# Patient Record
Sex: Male | Born: 1940
Health system: Southern US, Community
[De-identification: ages and names within clinical notes are randomized; demographics above are authoritative.]

## PROBLEM LIST (undated history)

## (undated) DIAGNOSIS — H47011 Ischemic optic neuropathy, right eye: Secondary | ICD-10-CM

## (undated) DIAGNOSIS — H269 Unspecified cataract: Secondary | ICD-10-CM

## (undated) DIAGNOSIS — I10 Essential (primary) hypertension: Secondary | ICD-10-CM

## (undated) DIAGNOSIS — M5126 Other intervertebral disc displacement, lumbar region: Secondary | ICD-10-CM

## (undated) DIAGNOSIS — E785 Hyperlipidemia, unspecified: Secondary | ICD-10-CM

## (undated) HISTORY — DX: Unspecified cataract: H26.9

## (undated) HISTORY — DX: Essential (primary) hypertension: I10

## (undated) HISTORY — PX: CATARACT EXTRACTION: SUR2

## (undated) HISTORY — DX: Ischemic optic neuropathy, right eye: H47.011

## (undated) HISTORY — PX: TONSILLECTOMY: SUR1361

## (undated) HISTORY — PX: OTHER SURGICAL HISTORY: SHX169

## (undated) HISTORY — DX: Other intervertebral disc displacement, lumbar region: M51.26

## (undated) HISTORY — DX: Hyperlipidemia, unspecified: E78.5

## (undated) HISTORY — PX: FINGER SURGERY: SHX640

---

## 1998-01-21 ENCOUNTER — Ambulatory Visit (HOSPITAL_COMMUNITY): Admission: RE | Admit: 1998-01-21 | Discharge: 1998-01-21 | Payer: Self-pay | Admitting: Internal Medicine

## 2001-02-10 ENCOUNTER — Encounter: Payer: Self-pay | Admitting: Urology

## 2001-02-10 ENCOUNTER — Ambulatory Visit (HOSPITAL_COMMUNITY): Admission: RE | Admit: 2001-02-10 | Discharge: 2001-02-10 | Payer: Self-pay | Admitting: Urology

## 2004-01-16 ENCOUNTER — Ambulatory Visit (HOSPITAL_COMMUNITY): Admission: RE | Admit: 2004-01-16 | Discharge: 2004-01-16 | Payer: Self-pay | Admitting: Oral Surgery

## 2004-01-29 ENCOUNTER — Encounter (INDEPENDENT_AMBULATORY_CARE_PROVIDER_SITE_OTHER): Payer: Self-pay | Admitting: *Deleted

## 2004-01-29 ENCOUNTER — Ambulatory Visit (HOSPITAL_COMMUNITY): Admission: RE | Admit: 2004-01-29 | Discharge: 2004-01-29 | Payer: Self-pay | Admitting: Oral Surgery

## 2004-03-24 ENCOUNTER — Ambulatory Visit (HOSPITAL_BASED_OUTPATIENT_CLINIC_OR_DEPARTMENT_OTHER): Admission: RE | Admit: 2004-03-24 | Discharge: 2004-03-24 | Payer: Self-pay | Admitting: Otolaryngology

## 2004-05-12 ENCOUNTER — Ambulatory Visit: Payer: Self-pay | Admitting: Oncology

## 2004-10-13 ENCOUNTER — Ambulatory Visit: Payer: Self-pay | Admitting: Gastroenterology

## 2004-11-24 ENCOUNTER — Ambulatory Visit: Payer: Self-pay | Admitting: Gastroenterology

## 2004-12-04 ENCOUNTER — Ambulatory Visit: Payer: Self-pay | Admitting: Gastroenterology

## 2009-11-15 ENCOUNTER — Encounter (INDEPENDENT_AMBULATORY_CARE_PROVIDER_SITE_OTHER): Payer: Self-pay | Admitting: *Deleted

## 2010-02-24 ENCOUNTER — Encounter (INDEPENDENT_AMBULATORY_CARE_PROVIDER_SITE_OTHER): Payer: Self-pay | Admitting: *Deleted

## 2010-03-18 ENCOUNTER — Encounter (INDEPENDENT_AMBULATORY_CARE_PROVIDER_SITE_OTHER): Payer: Self-pay | Admitting: *Deleted

## 2010-03-28 ENCOUNTER — Encounter (INDEPENDENT_AMBULATORY_CARE_PROVIDER_SITE_OTHER): Payer: Self-pay | Admitting: *Deleted

## 2010-03-28 ENCOUNTER — Encounter (INDEPENDENT_AMBULATORY_CARE_PROVIDER_SITE_OTHER): Payer: Self-pay

## 2010-04-01 ENCOUNTER — Ambulatory Visit: Payer: Self-pay | Admitting: Gastroenterology

## 2010-04-15 ENCOUNTER — Ambulatory Visit: Payer: Self-pay | Admitting: Gastroenterology

## 2010-04-16 ENCOUNTER — Encounter: Payer: Self-pay | Admitting: Gastroenterology

## 2010-07-15 NOTE — Letter (Signed)
Summary: Colonoscopy Letter  [facility]  [address] [place], [place] [address]   Phone: [phone]  Fax: [phone]      November 15, 2009 MRN: [id number]   CHATHAM HOWINGTON [address] [place], [place]  [address]   Dear Mr. Lupi,   According to your medical record, it is time for you to schedule a Colonoscopy. The American Cancer Society recommends this procedure as a method to detect early colon cancer. Patients with a family history of colon cancer, or a personal history of colon polyps or inflammatory bowel disease are at increased risk.  This letter has been generated based on the recommendations made at the time of your procedure. If you feel that in your particular situation this may no longer apply, please contact our office.  Please call our office at [phone] to schedule this appointment or to update your records at your earliest convenience.  Thank you for cooperating with [place] to provide you with the very best care possible.   Sincerely,  Wilhemina Bonito. Marina Goodell, M.D.  [facility] Gastroenterology Division [phone]

## 2010-07-15 NOTE — Letter (Signed)
Summary: Patient Notice- Polyp Results  [facility]  [address] [place], [place] [address]   Phone: [phone]  Fax: [phone]        April 16, 2010 MRN: [id number]    RONTAE INGLETT [address] [place], [place]  [address]    Dear Mr. Hangartner,  I am pleased to inform you that the colon polyp(s) removed during your recent colonoscopy was (were) found to be benign (no cancer detected) upon pathologic examination.  I recommend you have a repeat colonoscopy examination in 5 years to look for recurrent polyps, as having colon polyps increases your risk for having recurrent polyps or even colon cancer in the future.  Should you develop new or worsening symptoms of abdominal pain, bowel habit changes or bleeding from the rectum or bowels, please schedule an evaluation with either your primary care physician or with me.  Continue treatment plan as outlined the day of your exam.  Please call us if you are having persistent problems or have questions about your condition that have not been fully answered at this time.  Sincerely,  Meryl Dare MD [facility]  This letter has been electronically signed by your physician.  Appended Document: Patient Notice- Polyp Results letter mailed

## 2010-07-15 NOTE — Letter (Signed)
Summary: [facility] Instructions  [facility]  [address] [place], [place] [address]   Phone: [phone]  Fax: [phone]       [name]    07-Jun-1941    MRN: [id number]        Procedure Day /Date: Tuesday 04-15-10     Arrival Time: 9:30 am     Procedure Time: 10:30 am     Location of Procedure:                    _x _  [facility] (4th Floor)   PREPARATION FOR COLONOSCOPY WITH MOVIPREP   Starting 5 days prior to your procedure  04-10-10 do not eat nuts, seeds, popcorn, corn, beans, peas,  salads, or any raw vegetables.  Do not take any fiber supplements (e.g. Metamucil, Citrucel, and Benefiber).  THE DAY BEFORE YOUR PROCEDURE         DATE:  04-14-10  DAY:  Monday  1.  Drink clear liquids the entire day-NO SOLID FOOD  2.  Do not drink anything colored red or purple.  Avoid juices with pulp.  No orange juice.  3.  Drink at least 64 oz. (8 glasses) of fluid/clear liquids during the day to prevent dehydration and help the prep work efficiently.  CLEAR LIQUIDS INCLUDE: Water Jello Ice Popsicles Tea (sugar ok, no milk/cream) Powdered fruit flavored drinks Coffee (sugar ok, no milk/cream) Gatorade Juice: apple, white grape, white cranberry  Lemonade Clear bullion, consomm, broth Carbonated beverages (any kind) Strained chicken noodle soup Hard Candy                             4.  In the morning, mix first dose of MoviPrep solution:    Empty 1 Pouch A and 1 Pouch B into the disposable container    Add lukewarm drinking water to the top line of the container. Mix to dissolve    Refrigerate (mixed solution should be used within 24 hrs)  5.  Begin drinking the prep at 5:00 p.m. The MoviPrep container is divided by 4 marks.   Every 15 minutes drink the solution down to the next mark (approximately 8 oz) until the full liter is complete.   6.  Follow completed prep with 16 oz of clear liquid of your choice (Nothing red or purple).   Continue to drink clear liquids until bedtime.  7.  Before going to bed, mix second dose of MoviPrep solution:    Empty 1 Pouch A and 1 Pouch B into the disposable container    Add lukewarm drinking water to the top line of the container. Mix to dissolve    Refrigerate  THE DAY OF YOUR PROCEDURE      DATE:  04-15-10  DAY:  Tuesday  Beginning at  5:30 a.m. (5 hours before procedure):         1. Every 15 minutes, drink the solution down to the next mark (approx 8 oz) until the full liter is complete.  2. Follow completed prep with 16 oz. of clear liquid of your choice.    3. You may drink clear liquids until  8:30 a.m. (2 HOURS BEFORE PROCEDURE).   MEDICATION INSTRUCTIONS  Unless otherwise instructed, you should take regular prescription medications with a small sip of water   as early as possible the morning of your procedure.  Additional medication instructions: Do not take HCTZ am of procedure.  OTHER INSTRUCTIONS  You will need a responsible adult at least 71 years of age to accompany you and drive you home.   This person must remain in the waiting room during your procedure.  Wear loose fitting clothing that is easily removed.  Leave jewelry and other valuables at home.  However, you may wish to bring a book to read or  an iPod/MP3 player to listen to music as you wait for your procedure to start.  Remove all body piercing jewelry and leave at home.  Total time from sign-in until discharge is approximately 2-3 hours.  You should go home directly after your procedure and rest.  You can resume normal activities the  day after your procedure.  The day of your procedure you should not:   Drive   Make legal decisions   Operate machinery   Drink alcohol   Return to work  You will receive specific instructions about eating, activities and medications before you leave.    The above instructions have been reviewed and explained to me by   Ulis Rias  RN  April 01, 2010 1:48 PM     I fully understand and can verbalize these instructions _____________________________ Date _________

## 2010-07-15 NOTE — Miscellaneous (Signed)
Summary: Lec previsit  Clinical Lists Changes  Medications: Added new medication of MOVIPREP 100 GM  SOLR (PEG-KCL-NACL-NASULF-NA ASC-C) As per prep instructions. - Signed Rx of MOVIPREP 100 GM  SOLR (PEG-KCL-NACL-NASULF-NA ASC-C) As per prep instructions.;  #1 x 0;  Signed;  Entered by: Ulis Rias RN;  Authorized by: Meryl Dare MD [facility];  Method used: Electronically to [facility]. [facility] [phone]*, [address] [place], [place]  [id number], Ph: [phone], Fax: [phone] Observations: Added new observation of NKA: T (04/01/2010 13:19)    Prescriptions: MOVIPREP 100 GM  SOLR (PEG-KCL-NACL-NASULF-NA ASC-C) As per prep instructions.  #1 x 0   Entered by:   Ulis Rias RN   Authorized by:   Meryl Dare MD [facility]   Signed by:   Ulis Rias RN on 04/01/2010   Method used:   Electronically to        [facility]. [address] [phone]* (retail)       [address] [place], [place]  [id number]       Ph: [phone]       Fax: [phone]   RxID:   [id number]

## 2010-07-15 NOTE — Letter (Signed)
Summary: Pre Visit Letter Revised  [facility]  [address] [place], [place] [address]   Phone: [phone]  Fax: [phone]        03/28/2010 MRN: [id number] [name] [address] [place], [place]  [address]                Procedure Date:  04-15-10   Welcome to the Gastroenterology Division at [facility].    You are scheduled to see a nurse for your pre-procedure visit on 04-01-10 at 1:30p.m. on the 3rd floor at [facility], [address]  We ask that you try to arrive at our office 15 minutes prior to your appointment time to allow for check-in.  Please take a minute to review the attached form.  If you answer "Yes" to one or more of the questions on the first page, we ask that you call the person listed at your earliest opportunity.  If you answer "No" to all of the questions, please complete the rest of the form and bring it to your appointment.    Your nurse visit will consist of discussing your medical and surgical history, your immediate family medical history, and your medications.   If you are unable to list all of your medications on the form, please bring the medication bottles to your appointment and we will list them.  We will need to be aware of both prescribed and over the counter drugs.  We will need to know exact dosage information as well.    Please be prepared to read and sign documents such as consent forms, a financial agreement, and acknowledgement forms.  If necessary, and with your consent, a friend or relative is welcome to sit-in on the nurse visit with you.  Please bring your insurance card so that we may make a copy of it.  If your insurance requires a referral to see a specialist, please bring your referral form from your primary care physician.  No co-pay is required for this nurse visit.     If you cannot keep your appointment, please call [phone] to cancel or reschedule prior to your appointment date.  This allows [place] the  opportunity to schedule an appointment for another patient in need of care.    Thank you for choosing [facility] for your medical needs.  We appreciate the opportunity to care for you.  Please visit [place] at our website  to learn more about our practice.  Sincerely, The Gastroenterology Division

## 2010-07-15 NOTE — Letter (Signed)
Summary: Pre Visit Letter Revised  [facility]  [address] [place], [place] [address]   Phone: [phone]  Fax: [phone]        03/18/2010 MRN: [id number] [name] [address] [place], [place]  [address]             Procedure Date:  04-15-10   Welcome to the Gastroenterology Division at [facility].    You are scheduled to see a nurse for your pre-procedure visit on 04-01-10 at 1:30p.m. on the 3rd floor at [facility], [address].  We ask that you try to arrive at our office 15 minutes prior to your appointment time to allow for check-in.  Please take a minute to review the attached form.  If you answer "Yes" to one or more of the questions on the first page, we ask that you call the person listed at your earliest opportunity.  If you answer "No" to all of the questions, please complete the rest of the form and bring it to your appointment.    Your nurse visit will consist of discussing your medical and surgical history, your immediate family medical history, and your medications.   If you are unable to list all of your medications on the form, please bring the medication bottles to your appointment and we will list them.  We will need to be aware of both prescribed and over the counter drugs.  We will need to know exact dosage information as well.    Please be prepared to read and sign documents such as consent forms, a financial agreement, and acknowledgement forms.  If necessary, and with your consent, a friend or relative is welcome to sit-in on the nurse visit with you.  Please bring your insurance card so that we may make a copy of it.  If your insurance requires a referral to see a specialist, please bring your referral form from your primary care physician.  No co-pay is required for this nurse visit.     If you cannot keep your appointment, please call [phone] to cancel or reschedule prior to your appointment date.  This allows [place]  the opportunity to schedule an appointment for another patient in need of care.    Thank you for choosing [facility] for your medical needs.  We appreciate the opportunity to care for you.  Please visit [place] at our website  to learn more about our practice.  Sincerely, The Gastroenterology Division

## 2010-07-15 NOTE — Procedures (Signed)
Summary: Colonoscopy  Patient: [name] Note: All result statuses are Final unless otherwise noted.  Tests: (1) Colonoscopy (COL)   COL Colonoscopy           DONE     [facility]     [address]     [place], [place]  [address]           COLONOSCOPY PROCEDURE REPORT     PATIENT:  Kenneth, Murillo  MR#:  [id number]     BIRTHDATE:  December 20, 1940, 69 yrs. old  GENDER:  male     ENDOSCOPIST:  Judie Petit T. Russella Dar, MD, [facility]           PROCEDURE DATE:  04/15/2010     PROCEDURE:  Colonoscopy with biopsy and snare polypectomy     ASA CLASS:  Class II     INDICATIONS:  1) Elevated Risk Screening  2) family history of     colon cancer: sister at 12.     MEDICATIONS:   Fentanyl 50 mcg IV, Versed 7 mg IV     DESCRIPTION OF PROCEDURE:   After the risks benefits and     alternatives of the procedure were thoroughly explained, informed     consent was obtained.  Digital rectal exam was performed and     revealed no abnormalities.   The LB [id number]-[id number] endoscope     was introduced through the anus and advanced to the cecum, which     was identified by both the appendix and ileocecal valve, without     limitations.  The quality of the prep was excellent, using     MoviPrep.  The instrument was then slowly withdrawn as the colon     was fully examined.     <<PROCEDUREIMAGES>>     FINDINGS:  A sessile polyp was found in the proximal transverse     colon. It was 6 mm in size. Polyp was snared without cautery.     Retrieval was successful.  A sessile polyp was found in the     descending colon. It was 3 mm in size. The polyp was removed using     cold biopsy forceps.  A sessile polyp was found in the sigmoid     colon. It was 5 mm in size. Polyp was snared without cautery.     Retrieval was successful. Moderate diverticulosis was found in the     sigmoid to descending colon.  This was otherwise a normal     examination of the colon.  Retroflexed views in the rectum     revealed no  abnormalities. The time to cecum =  1.5  minutes. The     scope was then withdrawn (time =  12.25  min) from the patient and     the procedure completed.           COMPLICATIONS:  None           ENDOSCOPIC IMPRESSION:     1) 6 mm sessile polyp in the proximal transverse colon     2) 3 mm sessile polyp in the descending colon     3) 5 mm sessile polyp in the sigmoid colon     4) Moderate diverticulosis in the sigmoid to descending colon           RECOMMENDATIONS:     1) Await pathology results     2) High fiber diet with liberal fluid intake.     3) Repeat  Colonoscopy in 5 years pending pathology review           Malcolm T. Russella Dar, MD, [facility]           CC: Creola Corn, MD           n.     Rosalie DoctorVenita Lick. Stark at 04/15/2010 11:28 AM           Margo Aye, [id number]  Note: An exclamation mark (!) indicates a result that was not dispersed into the flowsheet. Document Creation Date: 04/15/2010 11:29 AM _______________________________________________________________________  (1) Order result status: Final Collection or observation date-time: 04/15/2010 11:22 Requested date-time:  Receipt date-time:  Reported date-time:  Referring Physician:   Ordering Physician: Claudette Head [phone]) Specimen Source:  Source: Launa Grill Order Number: [id number] Lab site:   Appended Document: Colonoscopy     Procedures Next Due Date:    Colonoscopy: 04/2015

## 2010-07-15 NOTE — Letter (Signed)
Summary: Pre Visit Letter Revised  [facility]  [address] [place], [place] [address]   Phone: [phone]  Fax: [phone]        02/24/2010 MRN: [id number] [name] [address] [place], [place]  [address]             Procedure Date:  04-15-10   Welcome to the Gastroenterology Division at [facility].    You are scheduled to see a nurse for your pre-procedure visit on 04-01-10 at 1:30P.M. on the 3rd floor at [facility], [address].  We ask that you try to arrive at our office 15 minutes prior to your appointment time to allow for check-in.  Please take a minute to review the attached form.  If you answer "Yes" to one or more of the questions on the first page, we ask that you call the person listed at your earliest opportunity.  If you answer "No" to all of the questions, please complete the rest of the form and bring it to your appointment.    Your nurse visit will consist of discussing your medical and surgical history, your immediate family medical history, and your medications.   If you are unable to list all of your medications on the form, please bring the medication bottles to your appointment and we will list them.  We will need to be aware of both prescribed and over the counter drugs.  We will need to know exact dosage information as well.    Please be prepared to read and sign documents such as consent forms, a financial agreement, and acknowledgement forms.  If necessary, and with your consent, a friend or relative is welcome to sit-in on the nurse visit with you.  Please bring your insurance card so that we may make a copy of it.  If your insurance requires a referral to see a specialist, please bring your referral form from your primary care physician.  No co-pay is required for this nurse visit.     If you cannot keep your appointment, please call [phone] to cancel or reschedule prior to your appointment date.  This allows [place]  the opportunity to schedule an appointment for another patient in need of care.    Thank you for choosing [facility] for your medical needs.  We appreciate the opportunity to care for you.  Please visit [place] at our website  to learn more about our practice.  Sincerely, The Gastroenterology Division

## 2010-10-31 NOTE — Op Note (Signed)
NAME:  [name], [name]                             ACCOUNT NO.:  [id number]   MEDICAL RECORD NO.:  [id number]                   PATIENT TYPE:  OIB   LOCATION:  2864                                 FACILITY:  [facility]   PHYSICIAN:  Sable Feil., D.D.S.        DATE OF BIRTH:  [birth year]   DATE OF PROCEDURE:  01/29/2004  DATE OF DISCHARGE:                                 OPERATIVE REPORT   PREOPERATIVE DIAGNOSIS:  Soft tissue lesion of the left mid face.   POSTOPERATIVE DIAGNOSIS:  Soft tissue lesion of the left mid face.   ANESTHESIA:  General via nasal endotracheal intubation.   SURGEON:  Lovena Le, D.D.S.   ASSISTANT:  Thyra Breed.   DESCRIPTION OF PROCEDURE:  The patient was brought to the operating room in  satisfactory preoperative condition and placed on the operating room table  in the supine position.  Following successful of general anesthesia via  nasal endotracheal intubation, the patient was prepped and draped in the  usual sterile fashion for a procedure of this type.  Initially, the face was  prepped with Betadine.  The oral cavity was irrigated with normal saline and  an oropharyngeal throat pack was placed which was removed at the conclusion  of the procedure.  Following this, approximately 5 mL of 2% lidocaine  solution with 1:100,000 epinephrine was infiltrated into the left buccal  mucosa and soft tissue of the left cheek.  Next, the Stensen's duct was  identified and protected. An incision was made in the mucosa superior to  Stensen's duct on the left extending approximately 3 cm in the left buccal  mucosa.  The incision extended through the mucosal layer.  Blunt dissection  was then carried out with a hemostat.  The lesion was then identified and  clamped with an Allis clamp.  Blunt dissection then continued as the lesion  was shelled out from its soft tissue bed.  Small vessels in the area were  then controlled with Bovie electrocautery.  The lesion  was excised en toto.  It measured approximately 4 cm in diameter and was yellow and soft  consistent with a lipoma.  A silk suture was placed at the posterior aspect  of the lesion for orientation for the pathology department.  The operative  site was then thoroughly irrigated and suctioned dry.  Good hemostasis was  noted to exist.  The surgical site was then closed with a 3-0 chromic gut  suture.  This completed the procedure and the patient was allowed to recover  from general anesthesia.  The throat pack was removed and the patient was  extubated in the operating room and transported to the post-anesthesia care  unit in satisfactory condition. All sponge, needle and instrument counts  were correct at the conclusion of the case.  Sable Feil., D.D.S.    JWB/MEDQ  D:  01/29/2004  T:  01/29/2004  Job:  [id number]

## 2010-10-31 NOTE — Op Note (Signed)
[name], [name]                   ACCOUNT NO.:  [id number]   MEDICAL RECORD NO.:  [id number]          PATIENT TYPE:  AMB   LOCATION:  DSC                          FACILITY:  [facility]   PHYSICIAN:  Karol T. Lazarus Salines, M.D. DATE OF BIRTH:  [birth year]   DATE OF PROCEDURE:  03/24/2004  DATE OF DISCHARGE:                                 OPERATIVE REPORT   PREOPERATIVE DIAGNOSIS:  Postoperative left buccal salivary seroma/fistula.   POSTOPERATIVE DIAGNOSIS:  Postoperative left buccal salivary seroma/fistula.   OPERATION PERFORMED:  Left buccal exploration with marsupialization,  salivary fistula.   SURGEON:  Gloris [place]. Lazarus Salines, M.D.   ANESTHESIA:  General nasotracheal.   ESTIMATED BLOOD LOSS:  Minimal.   COMPLICATIONS:  None.   FINDINGS:  A 2 cm incision in the left posterior gingival buccal sulcus with  a half inch Penrose drain coming out just immediately above the Stensen's  papilla which was somewhat indurated.  Some granulation around the edges of  the wound.  Upon exploring, Stensen's papilla could be cannulated with a  probe for about 3 to 4 mm but no further.  Exploration of the wound revealed  dense scar tissue.  At one point, a ductal structure approximately  in the  proper place and direction from Stensen's papilla was identified but could  not be further probed.  There was no distinct duct identified.  There was no  salivary flow noted at any point.  On the right side, Stensen's papilla and  duct easily probed and relatively superficial under the buccal mucosa  throughout its entire course.   DESCRIPTION OF PROCEDURE:  With the patient in the comfortable supine  position, having previously received Afrin nasal spray, nasotracheal  intubation through the left nostril was accomplished without difficulty.  Anesthesia was continued per nasotracheal tube.  At this point, the bed was  turned 90 degrees and the patient placed in a slight reverse Trendelenburg.  A clean preparation  and draping was accomplished.  The [place] mouth gag  was placed but was not felt to be adequate to retain the tongue out of the  surgical field and was removed.  The Crowe-Davis mouth gag was introduced,  expanded for visualization and this was adequate.  It was suspended on the  chest with a couple of folded towels, but not truly suspended.  The buccal  mucosa was explored and the previous drain was removed.  1% Xylocaine with  1:100,000 epinephrine, 6 mL total was infiltrated into the buccal mucosa for  intraoperative hemostasis.  Several minutes were allowed for this to take  effect.   Compression of the right parotid gland revealed free flow of saliva from the  right Stensen's papilla.  Compression of the left parotid gland revealed no  salivary flow.  Under microscopic visualization, Stensen's papilla was  identified and a #0 lacrimal probe was placed into the papilla for a  distance of 3 to 4 mm but could not be inserted any farther.  Some  granulation tissue at the edge of the drain wound was removed.  A sharp  scalpel  was used to cut from Stensen's papilla up into the wound proper in  an attempt to marsupialize the duct.  There was very dense scar tissue at  the inferior aspect of the wound which was gradually explored going back a  distance of 3 cm.  There was no identified duct or salivary flow.  A small  amount of granulation tissue was removed from the depths of the wound.  At  one point, a ductal structure seemed to be identified lateral to the  original Stensen's papilla location and this was amputated for a clean edge  and could be probed for a distance of 2 to 3 mm using a 0 lacrimal probe but  no farther.  No other ductal structures were identified.  The entire wound  was explored for greater than one hour looking at small areas which might  possibly contain the duct opening, small nubbins of scar tissue were opened  or removed as appropriate, but at no point was the  duct identified and with  repeated palpation and massage of the left parotid gland, no salivary flow  was identified at any time in any quantity.  At this point it was not felt  possible to further define the duct or place a lacrimal cannula in the duct.  Therefore, a Sialogram catheter with the needle cut off was laid into the  depth of the wound.  It was flexible Silastic approximately 2 mm in outer  diameter.  A side wall perforation was generated and brought out the wound  and then tunneled under the mucosa more anteriorly.  The catheter was  secured with a 4-0 nylon stitch to the original wound and was cut off and  the end was secured submucosally at the anterior wound.  This was done to  allow a knuckle of drain catheter to stick out of the wound but not to have  a free end which might more readily become dislodged.  After securing the  drain in two places in this fashion, the wound was loosely closed with  interrupted 4-0 sutures.  At this point the procedure was completed.  The  pharynx was irrigated and suctioned free.  A small throat pack had been  placed at the initiation of the procedure and was now removed.  The patient  was returned to anesthesia, awakened, extubated and transferred to recovery  in stable condition.   COMMENT:  A 70 year old white male now approximately six weeks status post  an intraoral removal of a facial lipoma by Lovena Le, D.D.S., oral  surgeon, with subsequent development of swelling, failing to respond on  drainage and consistent with a salivary seroma/fistula was the indication  for today's procedure.  Anticipate a routine postoperative recovery with  attention to ice,  elevation, analgesia, antibiosis, sialagogues, and oral  hygiene measures.  I would hope to leave the catheter in place six to eight  weeks and hope that it will define a salivary fistula which will not require any further exploration.  Should the patient redevelop signs of  salivary  seroma or infection, then a superficial parotidectomy would be the next  appropriate procedure to resolve his problems.  Meanwhile, given low  anticipated risks of post anesthetic or post surgical complications,  I feel  an outpatient venue is appropriate.      [place]   KTW/MEDQ  D:  03/24/2004  T:  03/24/2004  Job:  [id number]   cc:   Antony Madura, M.D.  [phone]  [address]  [place]  [place] [address]  Fax: [phone]   Sable Feil., D.D.S.  [address], [address]  [place]  [place] [address]  Fax: [phone]

## 2010-10-31 NOTE — Op Note (Signed)
NAME:  [name], [name]                             ACCOUNT NO.:  [id number]   MEDICAL RECORD NO.:  [id number]                   PATIENT TYPE:  OIB   LOCATION:  2864                                 FACILITY:  [facility]   PHYSICIAN:  Sable Feil., D.D.S.        DATE OF BIRTH:  1940/09/14   DATE OF PROCEDURE:  01/29/2004  DATE OF DISCHARGE:                                 OPERATIVE REPORT   ADDENDUM:  Upon intubation of the patient, it was noted that a lesion  existed on his epiglottis under direct laryngoscopy.  The lesion was  visualized and consisted of a yellowish submucosal lesion which was also  consistent in appearance and character with a lipoma.  The patient was  referred for further evaluation to an ear, nose and throat surgeon.  The  patient's wife was informed of this and the patient was informed as well.                                               Sable Feil., D.D.S.    JWB/MEDQ  D:  01/29/2004  T:  01/29/2004  Job:  [id number]

## 2012-10-19 ENCOUNTER — Other Ambulatory Visit: Payer: Self-pay | Admitting: Internal Medicine

## 2012-10-19 DIAGNOSIS — M436 Torticollis: Secondary | ICD-10-CM

## 2012-10-19 DIAGNOSIS — M542 Cervicalgia: Secondary | ICD-10-CM

## 2012-10-20 ENCOUNTER — Ambulatory Visit
Admission: RE | Admit: 2012-10-20 | Discharge: 2012-10-20 | Disposition: A | Discharge status: Home / Self Care | Payer: 59 | Source: Ambulatory Visit | Attending: Internal Medicine | Admitting: Internal Medicine

## 2012-10-20 DIAGNOSIS — M436 Torticollis: Secondary | ICD-10-CM

## 2012-10-20 DIAGNOSIS — M542 Cervicalgia: Secondary | ICD-10-CM

## 2012-10-22 ENCOUNTER — Other Ambulatory Visit: Payer: Self-pay

## 2013-11-15 ENCOUNTER — Ambulatory Visit (HOSPITAL_COMMUNITY)
Admission: RE | Admit: 2013-11-15 | Discharge: 2013-11-15 | Disposition: A | Discharge status: Home / Self Care | Payer: Medicare Other | Source: Ambulatory Visit | Attending: Vascular Surgery | Admitting: Vascular Surgery

## 2013-11-15 ENCOUNTER — Other Ambulatory Visit (HOSPITAL_COMMUNITY): Payer: Self-pay | Admitting: Internal Medicine

## 2013-11-15 DIAGNOSIS — G453 Amaurosis fugax: Secondary | ICD-10-CM

## 2013-11-15 DIAGNOSIS — H3400 Transient retinal artery occlusion, unspecified eye: Secondary | ICD-10-CM | POA: Insufficient documentation

## 2013-11-16 ENCOUNTER — Ambulatory Visit (HOSPITAL_COMMUNITY)
Admission: RE | Admit: 2013-11-16 | Discharge: 2013-11-16 | Disposition: A | Discharge status: Home / Self Care | Payer: Medicare Other | Source: Ambulatory Visit | Attending: Internal Medicine | Admitting: Internal Medicine

## 2013-11-16 DIAGNOSIS — H3400 Transient retinal artery occlusion, unspecified eye: Secondary | ICD-10-CM | POA: Insufficient documentation

## 2013-11-16 NOTE — Progress Notes (Signed)
2D Echo Performed 11/16/2013    Marygrace Drought, RCS

## 2013-11-20 ENCOUNTER — Telehealth (HOSPITAL_COMMUNITY): Payer: Self-pay | Admitting: *Deleted

## 2014-03-15 ENCOUNTER — Other Ambulatory Visit: Payer: Self-pay | Admitting: Internal Medicine

## 2014-03-15 DIAGNOSIS — N62 Hypertrophy of breast: Secondary | ICD-10-CM

## 2014-03-19 ENCOUNTER — Ambulatory Visit
Admission: RE | Admit: 2014-03-19 | Discharge: 2014-03-19 | Disposition: A | Discharge status: Home / Self Care | Payer: Medicare Other | Source: Ambulatory Visit | Attending: Internal Medicine | Admitting: Internal Medicine

## 2014-03-19 DIAGNOSIS — N62 Hypertrophy of breast: Secondary | ICD-10-CM

## 2014-05-29 ENCOUNTER — Other Ambulatory Visit: Payer: Self-pay | Admitting: Internal Medicine

## 2014-05-29 DIAGNOSIS — I6990 Unspecified sequelae of unspecified cerebrovascular disease: Secondary | ICD-10-CM

## 2014-06-03 ENCOUNTER — Ambulatory Visit
Admission: RE | Admit: 2014-06-03 | Discharge: 2014-06-03 | Disposition: A | Discharge status: Home / Self Care | Payer: Medicare Other | Source: Ambulatory Visit | Attending: Internal Medicine | Admitting: Internal Medicine

## 2014-06-03 DIAGNOSIS — I6990 Unspecified sequelae of unspecified cerebrovascular disease: Secondary | ICD-10-CM

## 2014-06-03 MED ORDER — GADOBENATE DIMEGLUMINE 529 MG/ML IV SOLN
15.0000 mL | Freq: Once | INTRAVENOUS | Status: AC | PRN
  Administered 2014-06-03: 15 mL via INTRAVENOUS

## 2014-11-02 ENCOUNTER — Encounter: Payer: Self-pay | Admitting: Gastroenterology

## 2015-04-29 ENCOUNTER — Encounter: Payer: Self-pay | Admitting: Gastroenterology

## 2015-05-22 ENCOUNTER — Encounter: Payer: Self-pay | Admitting: Gastroenterology

## 2015-07-02 ENCOUNTER — Ambulatory Visit (AMBULATORY_SURGERY_CENTER): Payer: Self-pay

## 2015-07-02 VITALS — Ht 66.0 in | Wt 160.2 lb

## 2015-07-02 DIAGNOSIS — Z800 Family history of malignant neoplasm of digestive organs: Secondary | ICD-10-CM

## 2015-07-02 DIAGNOSIS — Z86010 Personal history of colonic polyps: Secondary | ICD-10-CM

## 2015-07-02 MED ORDER — NA SULFATE-K SULFATE-MG SULF 17.5-3.13-1.6 GM/177ML PO SOLN: ORAL | Status: DC

## 2015-07-02 NOTE — Progress Notes (Signed)
Per pt, no allergies to soy or egg products.Pt not taking any weight loss meds or using  O2 at home. 

## 2015-07-16 ENCOUNTER — Encounter: Payer: Self-pay | Admitting: Gastroenterology

## 2015-07-16 ENCOUNTER — Ambulatory Visit (AMBULATORY_SURGERY_CENTER): Payer: Medicare Other | Admitting: Gastroenterology

## 2015-07-16 VITALS — BP 100/59 | HR 84 | Temp 97.3°F | Resp 14 | Ht 66.0 in | Wt 160.0 lb

## 2015-07-16 DIAGNOSIS — Z800 Family history of malignant neoplasm of digestive organs: Secondary | ICD-10-CM

## 2015-07-16 DIAGNOSIS — D122 Benign neoplasm of ascending colon: Secondary | ICD-10-CM | POA: Diagnosis not present

## 2015-07-16 DIAGNOSIS — Z86010 Personal history of colonic polyps: Secondary | ICD-10-CM

## 2015-07-16 MED ORDER — SODIUM CHLORIDE 0.9 % IV SOLN: 500.0000 mL | INTRAVENOUS | Status: DC

## 2015-07-16 NOTE — Op Note (Signed)
[place]  [address] [place], [address]   COLONOSCOPY PROCEDURE REPORT  PATIENT: [name], [name]  MR#: [id number] BIRTHDATE: [birth year] , 68  yrs. old GENDER: male ENDOSCOPIST: Ladene Artist, MD, Marval Regal REFERRED BY:  Shon Baton, M.D. PROCEDURE DATE:  07/16/2015 PROCEDURE:   Colonoscopy, surveillance and Colonoscopy with snare polypectomy First Screening Colonoscopy - Avg.  risk and is 50 yrs.  old or older - No.  Prior Negative Screening - Now for repeat screening. N/A  History of Adenoma - Now for follow-up colonoscopy & has been > or = to 3 yrs.  Yes hx of adenoma.  Has been 3 or more years since last colonoscopy.  Polyps removed today? Yes ASA CLASS:   Class II INDICATIONS:Surveillance due to prior colonic neoplasia and PH Colon Adenoma. MEDICATIONS: Monitored anesthesia care, Propofol 100 mg IV, and ephedrine 20 mg IV DESCRIPTION OF PROCEDURE:   After the risks benefits and alternatives of the procedure were thoroughly explained, informed consent was obtained.  The digital rectal exam revealed no abnormalities of the rectum.   The LB CF-H180AL Loaner [id number] endoscope was introduced through the anus and advanced to the cecum, which was identified by both the appendix and ileocecal valve. No adverse events experienced.   The quality of the prep was good.  (Suprep was used)  The instrument was then slowly withdrawn as the colon was fully examined. Estimated blood loss is zero unless otherwise noted in this procedure report.    COLON FINDINGS: A sessile polyp measuring 6 mm in size was found in the ascending colon.  A polypectomy was performed with a cold snare.  The resection was complete, the polyp tissue was completely retrieved and sent to histology.   There was moderate diverticulosis noted in the sigmoid colon.   The examination was otherwise normal.  Retroflexed views revealed internal Grade I hemorrhoids. The time to cecum = 1.4 Withdrawal time =  10.1   The scope was withdrawn and the procedure completed. COMPLICATIONS: There were no immediate complications.  ENDOSCOPIC IMPRESSION: 1.   Sessile polyp in the ascending colon; polypectomy performed with a cold snare 2.   Moderate diverticulosis in the sigmoid colon 3.   Grade l internal hemorrhoids  RECOMMENDATIONS: 1.  Await pathology results 2.  High fiber diet with liberal fluid intake. 3.  Given your age, you will not need another colonoscopy for colon cancer screening or polyp surveillance.  These types of tests usually stop around the age 55.  eSigned:  Ladene Artist, MD, [facility] 07/16/2015 11:14 AM

## 2015-07-16 NOTE — Patient Instructions (Signed)
YOU HAD AN ENDOSCOPIC PROCEDURE TODAY AT [facility]:   Refer to the procedure report that was given to you for any specific questions about what was found during the examination.  If the procedure report does not answer your questions, please call your gastroenterologist to clarify.  If you requested that your care partner not be given the details of your procedure findings, then the procedure report has been included in a sealed envelope for you to review at your convenience later.  YOU SHOULD EXPECT: Some feelings of bloating in the abdomen. Passage of more gas than usual.  Walking can help get rid of the air that was put into your GI tract during the procedure and reduce the bloating. If you had a lower endoscopy (such as a colonoscopy or flexible sigmoidoscopy) you may notice spotting of blood in your stool or on the toilet paper. If you underwent a bowel prep for your procedure, you may not have a normal bowel movement for a few days.  Please Note:  You might notice some irritation and congestion in your nose or some drainage.  This is from the oxygen used during your procedure.  There is no need for concern and it should clear up in a day or so.  SYMPTOMS TO REPORT IMMEDIATELY:   Following lower endoscopy (colonoscopy or flexible sigmoidoscopy):  Excessive amounts of blood in the stool  Significant tenderness or worsening of abdominal pains  Swelling of the abdomen that is new, acute  Fever of 100F or higher   For urgent or emergent issues, a gastroenterologist can be reached at any hour by calling [phone].   DIET: Your first meal following the procedure should be a small meal and then it is ok to progress to your normal diet. Heavy or fried foods are harder to digest and may make you feel nauseous or bloated.  Likewise, meals heavy in dairy and vegetables can increase bloating.  Drink plenty of fluids but you should avoid alcoholic beverages for 24 hours.  Try to  increase the fiber in your diet due to the hemorrhoids and diverticulosis. Increase the water intake too.  ACTIVITY:  You should plan to take it easy for the rest of today and you should NOT DRIVE or use heavy machinery until tomorrow (because of the sedation medicines used during the test).    FOLLOW UP: Our staff will call the number listed on your records the next business day following your procedure to check on you and address any questions or concerns that you may have regarding the information given to you following your procedure. If we do not reach you, we will leave a message.  However, if you are feeling well and you are not experiencing any problems, there is no need to return our call.  We will assume that you have returned to your regular daily activities without incident.  If any biopsies were taken you will be contacted by phone or by letter within the next 1-3 weeks.  Please call us at [phone] if you have not heard about the biopsies in 3 weeks.    SIGNATURES/CONFIDENTIALITY: You and/or your care partner have signed paperwork which will be entered into your electronic medical record.  These signatures attest to the fact that that the information above on your After Visit Summary has been reviewed and is understood.  Full responsibility of the confidentiality of this discharge information lies with you and/or your care-partner.  Read all of the  handouts given to you by your recovery room nurse.   Thank-you for choosing [place] for your healthcare needs.

## 2015-07-16 NOTE — Progress Notes (Signed)
Report to PACU, RN, vss, BBS= Clear.  

## 2015-07-16 NOTE — Progress Notes (Signed)
Called to room to assist during endoscopic procedure.  Patient ID and intended procedure confirmed with present staff. Received instructions for my participation in the procedure from the performing physician.  

## 2015-07-17 ENCOUNTER — Telehealth: Payer: Self-pay | Admitting: *Deleted

## 2015-07-17 NOTE — Telephone Encounter (Signed)
  Follow up Call-  Call back number 07/16/2015  Post procedure Call Back phone  # [phone]  Permission to leave phone message Yes     Patient questions:  Do you have a fever, pain , or abdominal swelling? No. Pain Score  0 *  Have you tolerated food without any problems? Yes.    Have you been able to return to your normal activities? Yes.    Do you have any questions about your discharge instructions: Diet   No. Medications  No. Follow up visit  No.  Do you have questions or concerns about your Care? No.  Actions: * If pain score is 4 or above: No action needed, pain <4.

## 2015-07-23 ENCOUNTER — Encounter: Payer: Self-pay | Admitting: Gastroenterology

## 2016-08-25 ENCOUNTER — Telehealth: Payer: Self-pay

## 2016-08-25 NOTE — Telephone Encounter (Signed)
SENT NOTES TO SCHEDULING 

## 2016-09-02 ENCOUNTER — Encounter: Payer: Self-pay | Admitting: *Deleted

## 2016-09-14 ENCOUNTER — Ambulatory Visit: Payer: Medicare Other | Admitting: Cardiology

## 2016-09-18 ENCOUNTER — Observation Stay: Payer: Medicare Other

## 2016-09-18 ENCOUNTER — Encounter: Payer: Self-pay | Admitting: Emergency Medicine

## 2016-09-18 ENCOUNTER — Emergency Department: Payer: Medicare Other

## 2016-09-18 ENCOUNTER — Observation Stay
Admission: EM | Admit: 2016-09-18 | Discharge: 2016-09-19 | Disposition: A | Discharge status: Home / Self Care | Payer: Medicare Other | Attending: Internal Medicine | Admitting: Internal Medicine

## 2016-09-18 DIAGNOSIS — M5021 Other cervical disc displacement,  high cervical region: Secondary | ICD-10-CM | POA: Insufficient documentation

## 2016-09-18 DIAGNOSIS — Z79899 Other long term (current) drug therapy: Secondary | ICD-10-CM | POA: Diagnosis not present

## 2016-09-18 DIAGNOSIS — Z809 Family history of malignant neoplasm, unspecified: Secondary | ICD-10-CM | POA: Insufficient documentation

## 2016-09-18 DIAGNOSIS — Z7982 Long term (current) use of aspirin: Secondary | ICD-10-CM | POA: Insufficient documentation

## 2016-09-18 DIAGNOSIS — Z9841 Cataract extraction status, right eye: Secondary | ICD-10-CM | POA: Insufficient documentation

## 2016-09-18 DIAGNOSIS — M549 Dorsalgia, unspecified: Secondary | ICD-10-CM | POA: Diagnosis present

## 2016-09-18 DIAGNOSIS — M4602 Spinal enthesopathy, cervical region: Secondary | ICD-10-CM | POA: Insufficient documentation

## 2016-09-18 DIAGNOSIS — R079 Chest pain, unspecified: Secondary | ICD-10-CM | POA: Diagnosis not present

## 2016-09-18 DIAGNOSIS — M47892 Other spondylosis, cervical region: Secondary | ICD-10-CM | POA: Insufficient documentation

## 2016-09-18 DIAGNOSIS — Z87442 Personal history of urinary calculi: Secondary | ICD-10-CM | POA: Insufficient documentation

## 2016-09-18 DIAGNOSIS — Z823 Family history of stroke: Secondary | ICD-10-CM | POA: Diagnosis not present

## 2016-09-18 DIAGNOSIS — Z9842 Cataract extraction status, left eye: Secondary | ICD-10-CM | POA: Diagnosis not present

## 2016-09-18 DIAGNOSIS — E785 Hyperlipidemia, unspecified: Secondary | ICD-10-CM | POA: Diagnosis not present

## 2016-09-18 DIAGNOSIS — I10 Essential (primary) hypertension: Secondary | ICD-10-CM | POA: Insufficient documentation

## 2016-09-18 DIAGNOSIS — H47011 Ischemic optic neuropathy, right eye: Secondary | ICD-10-CM | POA: Diagnosis not present

## 2016-09-18 DIAGNOSIS — R200 Anesthesia of skin: Secondary | ICD-10-CM | POA: Insufficient documentation

## 2016-09-18 DIAGNOSIS — Z800 Family history of malignant neoplasm of digestive organs: Secondary | ICD-10-CM | POA: Diagnosis not present

## 2016-09-18 LAB — BASIC METABOLIC PANEL
Anion gap: 6 (ref 5–15)
BUN: 16 mg/dL (ref 6–20)
CHLORIDE: 103 mmol/L (ref 101–111)
CO2: 28 mmol/L (ref 22–32)
CREATININE: 1.16 mg/dL (ref 0.61–1.24)
Calcium: 9.1 mg/dL (ref 8.9–10.3)
GFR calc Af Amer: >60 mL/min (ref >60)
GFR, EST NON AFRICAN AMERICAN: 59 mL/min — AB (ref >60)
GLUCOSE: 104 mg/dL — AB (ref 65–99)
Potassium: 4.1 mmol/L (ref 3.5–5.1)
SODIUM: 137 mmol/L (ref 135–145)

## 2016-09-18 LAB — CBC
HCT: 44.2 % (ref 40.0–52.0)
Hemoglobin: 15.5 g/dL (ref 13.0–18.0)
MCH: 33.3 pg (ref 26.0–34.0)
MCHC: 35 g/dL (ref 32.0–36.0)
MCV: 95 fL (ref 80.0–100.0)
PLATELETS: 189 10*3/uL (ref 150–440)
RBC: 4.65 MIL/uL (ref 4.40–5.90)
RDW: 13.2 % (ref 11.5–14.5)
WBC: 6.3 10*3/uL (ref 3.8–10.6)

## 2016-09-18 LAB — TROPONIN I: Troponin I: <0.03 ng/mL (ref <0.03)

## 2016-09-18 LAB — VITAMIN B12: VITAMIN B 12: 793 pg/mL (ref 180–914)

## 2016-09-18 MED ORDER — ACETAMINOPHEN 325 MG PO TABS: 650.0000 mg | ORAL_TABLET | Freq: Four times a day (QID) | ORAL | Status: DC | PRN

## 2016-09-18 MED ORDER — ONDANSETRON HCL 4 MG PO TABS: 4.0000 mg | ORAL_TABLET | Freq: Four times a day (QID) | ORAL | Status: DC | PRN

## 2016-09-18 MED ORDER — ALUM & MAG HYDROXIDE-SIMETH 200-200-20 MG/5ML PO SUSP
30.0000 mL | ORAL | Status: DC | PRN
  Administered 2016-09-18: 30 mL via ORAL
  Filled 2016-09-18: qty 30

## 2016-09-18 MED ORDER — SODIUM CHLORIDE 0.9% FLUSH: 3.0000 mL | INTRAVENOUS | Status: DC | PRN

## 2016-09-18 MED ORDER — ASPIRIN EC 325 MG PO TBEC: 325.0000 mg | DELAYED_RELEASE_TABLET | Freq: Every day | ORAL | Status: DC

## 2016-09-18 MED ORDER — SODIUM CHLORIDE 0.9 % IV SOLN: 250.0000 mL | INTRAVENOUS | Status: DC | PRN

## 2016-09-18 MED ORDER — ACETAMINOPHEN 650 MG RE SUPP
650.0000 mg | Freq: Four times a day (QID) | RECTAL | Status: DC | PRN
Start: 2016-09-18 — End: 2016-09-19

## 2016-09-18 MED ORDER — ONDANSETRON HCL 4 MG/2ML IJ SOLN: 4.0000 mg | Freq: Four times a day (QID) | INTRAMUSCULAR | Status: DC | PRN

## 2016-09-18 MED ORDER — ENOXAPARIN SODIUM 40 MG/0.4ML ~~LOC~~ SOLN
40.0000 mg | SUBCUTANEOUS | Status: DC
  Administered 2016-09-18: 40 mg via SUBCUTANEOUS
  Filled 2016-09-18: qty 0.4

## 2016-09-18 MED ORDER — ASPIRIN 325 MG PO TABS
325.0000 mg | ORAL_TABLET | Freq: Every day | ORAL | Status: DC
  Administered 2016-09-18: 325 mg via ORAL
  Filled 2016-09-18 (×2): qty 1

## 2016-09-18 MED ORDER — OCUVITE-LUTEIN PO CAPS
1.0000 | ORAL_CAPSULE | Freq: Two times a day (BID) | ORAL | Status: DC
  Administered 2016-09-18 – 2016-09-19 (×2): 1 via ORAL
  Filled 2016-09-18: qty 1

## 2016-09-18 MED ORDER — SODIUM CHLORIDE 0.9% FLUSH
3.0000 mL | Freq: Two times a day (BID) | INTRAVENOUS | Status: DC
  Administered 2016-09-18 (×2): 3 mL via INTRAVENOUS

## 2016-09-18 MED ORDER — ATORVASTATIN CALCIUM 10 MG PO TABS
10.0000 mg | ORAL_TABLET | Freq: Every day | ORAL | Status: DC
Start: 2016-09-18 — End: 2016-09-19
  Administered 2016-09-18: 10 mg via ORAL
  Filled 2016-09-18 (×2): qty 1

## 2016-09-18 MED ORDER — RAMIPRIL 5 MG PO CAPS
10.0000 mg | ORAL_CAPSULE | Freq: Every day | ORAL | Status: DC
  Administered 2016-09-19: 10 mg via ORAL
  Filled 2016-09-18: qty 2

## 2016-09-18 MED ORDER — HYDROCHLOROTHIAZIDE 12.5 MG PO CAPS
12.5000 mg | ORAL_CAPSULE | Freq: Every day | ORAL | Status: DC
Start: 2016-09-18 — End: 2016-09-19
  Filled 2016-09-18: qty 1

## 2016-09-18 MED ORDER — RENA-VITE PO TABS
1.0000 | ORAL_TABLET | Freq: Every day | ORAL | Status: DC
  Administered 2016-09-18: 1 via ORAL
  Filled 2016-09-18 (×2): qty 1

## 2016-09-18 NOTE — ED Triage Notes (Signed)
Pt to ED via POV, pt states that for the past 3 weeks he has been having chest pain on and off. Patient also states that he will get a burning sensation between his shoulder blades. Pt also states that today he noticed that he was having some left arm numbness, pt states that this started about 1 hour PTA, numbness has since resolved. Pt in NAD, breathing equal and unlabored, color WNL.

## 2016-09-18 NOTE — ED Notes (Signed)
Pt returned from MRI, will take to floor

## 2016-09-18 NOTE — Progress Notes (Signed)
Patient admitted to room 247 from ED. Patient a&o x4, VSS, no complaints of pain. Heart monitor 40-20 applied, verified with CCMD. Head to toe skin assessment completed - no skin issues noted at this time. Educated patient on calling for assistance, ordering meals. Will continue to reassess pain levels.

## 2016-09-18 NOTE — Plan of Care (Signed)
Problem: Pain Managment: Goal: General experience of comfort will improve Outcome: Progressing Patient has no complaints of pain at this time. Encouraged to monitor his pain, will continue to reassess pain scale.

## 2016-09-18 NOTE — ED Provider Notes (Signed)
[facility] Emergency Department Provider Note   ____________________________________________   First MD Initiated Contact with Patient 09/18/16 1224     (approximate)  I have reviewed the triage vital signs and the nursing notes.   HISTORY  Chief Complaint Chest Pain    HPI [name] is a 76 y.o. male history of hypertension. Reports that for 2 weeks she's had some abnormal burning feeling in his chest and back. Comes and goes sometimes lasting an hour. He saw his doctor who referred him to cardiologist, but his appointment had to be canceled last week.  Patient currently not having symptoms, but did have some tingling into his left arm and radiation to left arm this morning which was new. He has taken a full strength aspirin.  Denies previous cardiac history.   Past Medical History:  Diagnosis Date  . Cataract   . Hyperlipidemia   . Hypertension   . Ischemic optic neuropathy of right eye    anterior  . Lumbar herniated disc     There are no active problems to display for this patient.   Past Surgical History:  Procedure Laterality Date  . CATARACT EXTRACTION     11/01/06-left eye    12/17/09-right eye  . kidney stones     lithotripsy done  . TONSILLECTOMY      Prior to Admission medications   Medication Sig Start Date End Date Taking? Authorizing Provider  aspirin 325 MG tablet Take 325 mg by mouth daily.    Historical Provider, MD  atorvastatin (LIPITOR) 10 MG tablet Take 10 mg by mouth daily.    Historical Provider, MD  FOLIC ACID PO Take 2.2 mg by mouth daily.    Historical Provider, MD  hydrochlorothiazide (MICROZIDE) 12.5 MG capsule Take 12.5 mg by mouth daily.    Historical Provider, MD  Multiple Vitamins-Minerals (PRESERVISION AREDS 2 PO) Take by mouth 2 (two) times daily.    Historical Provider, MD  ramipril (ALTACE) 10 MG capsule Take 10 mg by mouth daily.    Historical Provider, MD    Allergies Patient has no known  allergies.  Family History  Problem Relation Age of Onset  . CVA Mother   . Cancer - Other Father   . Colon cancer Sister     Social History Social History  Substance Use Topics  . Smoking status: Never Smoker  . Smokeless tobacco: Never Used  . Alcohol use No    Review of Systems Constitutional: No fever/chills ENT: No sore throat. Cardiovascular: See history of present illness  Respiratory: Denies shortness of breath. Gastrointestinal: No abdominal pain.  No nausea, no vomiting.  No diarrhea.  No constipation. Musculoskeletal: Negative for back pain except for some chronic back discomfort which is not changed his lower back. Skin: Negative for rash. Neurological: Negative for headaches, focal weakness or numbness.  10-point ROS otherwise negative.  ____________________________________________   PHYSICAL EXAM:  VITAL SIGNS: ED Triage Vitals  Enc Vitals Group     BP 09/18/16 1108 118/67     Pulse Rate 09/18/16 1108 71     Resp 09/18/16 1108 16     Temp 09/18/16 1108 97.9 F (36.6 C)     Temp Source 09/18/16 1108 Oral     SpO2 09/18/16 1108 100 %     Weight --      Height --      Head Circumference --      Peak Flow --      Pain  Score 09/18/16 1102 2     Pain Loc --      Pain Edu? --      Excl. in [place]? --     Constitutional: Alert and oriented. Well appearing and in no acute distress. Eyes: Conjunctivae are normal. PERRL. EOMI. Head: Atraumatic. Nose: No congestion/rhinnorhea. Mouth/Throat: Mucous membranes are moist.  Oropharynx non-erythematous. Neck: No stridor.  No cervical or thoracic tenderness. No rash noted over the back. Cardiovascular: Normal rate, regular rhythm. Grossly normal heart sounds.  Good peripheral circulation. Respiratory: Normal respiratory effort.  No retractions. Lungs CTAB. Gastrointestinal: Soft and nontender. No distention.  Musculoskeletal: No lower extremity tenderness nor edema.  No joint effusions. Neurologic:  Normal  speech and language. No gross focal neurologic deficits are appreciated. No gait instability. No pronator drift. Denies any loss of sensation in the left upper extremity. Reports old tingling numbness feeling has gone away. Normal cranial nerve exam. Clear normal speech. Skin:  Skin is warm, dry and intact. No rash noted. Psychiatric: Mood and affect are normal. Speech and behavior are normal.  ____________________________________________   LABS (all labs ordered are listed, but only abnormal results are displayed)  Labs Reviewed  BASIC METABOLIC PANEL - Abnormal; Notable for the following:       Result Value   Glucose, Bld 104 (*)    GFR calc non Af Amer 59 (*)    All other components within normal limits  CBC  TROPONIN I   ____________________________________________  EKG  Reviewed and interpreted by me at 11 AM Heart rate 60 QRS 85 QTC 400 Normal sinus rhythm, there is some slight PR depression versus minimal ST abnormality, not 1 mm noted in lead 2, and 3, aVF. There is no evidence of acute ST elevation MI. Nonspecific T-wave abnormality is questioned.  On repeat EKG performed at 1232 reviewed and interpreted by me heart rate is 16 QRS 80 QTC 420 Slight baseline wander, no significant change from previous with questionable PR depression versus minimal ST abnormality in inferior leads again noted ____________________________________________  RADIOLOGY  Dg Chest 2 View  Result Date: 09/18/2016 CLINICAL DATA:  Chest pain. EXAM: CHEST  2 VIEW COMPARISON:  01/24/2004. FINDINGS: Mediastinum hilar structures are normal. No focal infiltrate. No pleural effusion or pneumothorax. Degenerative changes thoracic spine again noted. IMPRESSION: No acute cardiopulmonary disease. Electronically Signed   By: Marcello Moores  Register   On: 09/18/2016 11:45    ____________________________________________   PROCEDURES  Procedure(s) performed: None  Procedures  Critical Care performed:  No  ____________________________________________   INITIAL IMPRESSION / ASSESSMENT AND PLAN / ED COURSE  Pertinent labs & imaging results that were available during my care of the patient were reviewed by me and considered in my medical decision making (see chart for details).  Initial evaluation of chest discomfort. Somewhat atypical in nature, but reports slight worsening with some numbness radiating to his left arm this morning. He is now asymptomatic. His EKG is a questionable nonspecific abnormality versus slight PR depression or minimal ST elevation less than 1 mm in inferior leads. He took full dose aspirin today and is currently asymptomatic  Repeat EKG does not demonstrate any acute change.  No shortness of breath. No hypoxia. No tachycardia. No pleuritic chest pain. I do not believe the patient is suffering from pulmonary embolism, dissection, pneumothorax or an acute pulmonary process.    Clinical Course as of Sep 19 1306  Fri Sep 18, 2016  1307 Patient remains a dramatic at this time. Discussed  with him observation versus repeat testing and possible discharge to home, patient is wife have opted for further observation. At this point no clear evidence of ischemia, but is clinical history and risk factors including age are concerning prompting [place] to admit patient to hospital for further workup.  [MQ]    Clinical Course User Index [MQ] Delman Kitten, MD     ____________________________________________   FINAL CLINICAL IMPRESSION(S) / ED DIAGNOSES  Final diagnoses:  Moderate risk chest pain      NEW MEDICATIONS STARTED DURING THIS VISIT:  New Prescriptions   No medications on file     Note:  This document was prepared using Dragon voice recognition software and may include unintentional dictation errors.     Delman Kitten, MD 09/18/16 1318

## 2016-09-18 NOTE — ED Notes (Signed)
Patient transported to MRI 

## 2016-09-18 NOTE — H&P (Signed)
[facility] [place] NAME: [name]    MR#:  [id number]  DATE OF BIRTH:  10-07-1940  DATE OF ADMISSION:  09/18/2016  PRIMARY CARE PHYSICIAN: Precious Reel, MD   REQUESTING/REFERRING PHYSICIAN: Delman Kitten MD  CHIEF COMPLAINT:   Chief Complaint  Patient presents with  . Chest Pain    HISTORY OF PRESENT ILLNESS: [name]  is a 76 y.o. male with a known history of Essential hypertension, hyperlipidemia who is presenting to the emergency room with complaint of left-sided and substernal chest pain. Patient reports that for the past few days he's been having this chest discomfort. Described as sharp type of pain worse with burping. He also experienced some numbness in his left arm which has resolved today. He denies any shortness of breath no nausea vomiting or diarrhea.  Patient also complains of burning and discomfort in his cervical spine spine between his shoulder blades ongoing for 1 month. PAST MEDICAL HISTORY:   Past Medical History:  Diagnosis Date  . Cataract   . Hyperlipidemia   . Hypertension   . Ischemic optic neuropathy of right eye    anterior  . Lumbar herniated disc     PAST SURGICAL HISTORY: Past Surgical History:  Procedure Laterality Date  . CATARACT EXTRACTION     11/01/06-left eye    12/17/09-right eye  . kidney stones     lithotripsy done  . TONSILLECTOMY      SOCIAL HISTORY:  Social History  Substance Use Topics  . Smoking status: Never Smoker  . Smokeless tobacco: Never Used  . Alcohol use No    FAMILY HISTORY:  Family History  Problem Relation Age of Onset  . CVA Mother   . Cancer - Other Father   . Colon cancer Sister     DRUG ALLERGIES: No Known Allergies  REVIEW OF SYSTEMS:   CONSTITUTIONAL: No fever, fatigue or weakness.  EYES: No blurred or double vision.  EARS, NOSE, AND THROAT: No tinnitus or ear pain.  RESPIRATORY: No cough, shortness of breath, wheezing or hemoptysis.  CARDIOVASCULAR:  Positive chest pain, no orthopnea, edema.  GASTROINTESTINAL: No nausea, vomiting, diarrhea or abdominal pain.  GENITOURINARY: No dysuria, hematuria.  ENDOCRINE: No polyuria, nocturia,  HEMATOLOGY: No anemia, easy bruising or bleeding SKIN: No rash or lesion. MUSCULOSKELETAL: No joint pain or arthritis.   NEUROLOGIC: No tingling, numbness, weakness.  PSYCHIATRY: No anxiety or depression.   MEDICATIONS AT HOME:  Prior to Admission medications   Medication Sig Start Date End Date Taking? Authorizing Provider  aspirin 325 MG tablet Take 325 mg by mouth daily.   Yes Historical Provider, MD  atorvastatin (LIPITOR) 10 MG tablet Take 10 mg by mouth daily.   Yes Historical Provider, MD  FOLPLEX 2.2 2.2-25-0.5 MG TABS Take 1 tablet by mouth daily. 08/29/16  Yes Historical Provider, MD  hydrochlorothiazide (MICROZIDE) 12.5 MG capsule Take 12.5 mg by mouth daily.   Yes Historical Provider, MD  Multiple Vitamins-Minerals (PRESERVISION AREDS 2 PO) Take by mouth 2 (two) times daily.   Yes Historical Provider, MD  ramipril (ALTACE) 10 MG capsule Take 10 mg by mouth daily.   Yes Historical Provider, MD      PHYSICAL EXAMINATION:   VITAL SIGNS: Blood pressure 118/67, pulse 71, temperature 97.9 F (36.6 C), temperature source Oral, resp. rate 16, SpO2 100 %.  GENERAL:  76 y.o.-year-old patient lying in the bed with no acute distress.  EYES: Pupils equal, round, reactive to  light and accommodation. No scleral icterus. Extraocular muscles intact.  HEENT: Head atraumatic, normocephalic. Oropharynx and nasopharynx clear.  NECK:  Supple, no jugular venous distention. No thyroid enlargement, no tenderness.  LUNGS: Normal breath sounds bilaterally, no wheezing, rales,rhonchi or crepitation. No use of accessory muscles of respiration.  CARDIOVASCULAR: S1, S2 normal. No murmurs, rubs, or gallops.  ABDOMEN: Soft, nontender, nondistended. Bowel sounds present. No organomegaly or mass.  EXTREMITIES: No pedal  edema, cyanosis, or clubbing.  NEUROLOGIC: Cranial nerves II through XII are intact. Muscle strength 5/5 in all extremities. Sensation intact. Gait not checked.  PSYCHIATRIC: The patient is alert and oriented x 3.  SKIN: No obvious rash, lesion, or ulcer.   LABORATORY PANEL:   CBC  Recent Labs Lab 09/18/16 1105  WBC 6.3  HGB 15.5  HCT 44.2  PLT 189  MCV 95.0  MCH 33.3  MCHC 35.0  RDW 13.2   ------------------------------------------------------------------------------------------------------------------  Chemistries   Recent Labs Lab 09/18/16 1105  NA 137  K 4.1  CL 103  CO2 28  GLUCOSE 104*  BUN 16  CREATININE 1.16  CALCIUM 9.1   ------------------------------------------------------------------------------------------------------------------ CrCl cannot be calculated (Unknown ideal weight.). ------------------------------------------------------------------------------------------------------------------ No results for input(s): TSH, T4TOTAL, T3FREE, THYROIDAB in the last 72 hours.  Invalid input(s): FREET3   Coagulation profile No results for input(s): INR, PROTIME in the last 168 hours. ------------------------------------------------------------------------------------------------------------------- No results for input(s): DDIMER in the last 72 hours. -------------------------------------------------------------------------------------------------------------------  Cardiac Enzymes  Recent Labs Lab 09/18/16 1105  TROPONINI <0.03   ------------------------------------------------------------------------------------------------------------------ Invalid input(s): POCBNP  ---------------------------------------------------------------------------------------------------------------  Urinalysis No results found for: COLORURINE, APPEARANCEUR, LABSPEC, PHURINE, GLUCOSEU, HGBUR, BILIRUBINUR, KETONESUR, PROTEINUR, UROBILINOGEN, NITRITE,  LEUKOCYTESUR   RADIOLOGY: Dg Chest 2 View  Result Date: 09/18/2016 CLINICAL DATA:  Chest pain. EXAM: CHEST  2 VIEW COMPARISON:  01/24/2004. FINDINGS: Mediastinum hilar structures are normal. No focal infiltrate. No pleural effusion or pneumothorax. Degenerative changes thoracic spine again noted. IMPRESSION: No acute cardiopulmonary disease. Electronically Signed   By: Marcello Moores  Register   On: 09/18/2016 11:45    EKG: Orders placed or performed during the hospital encounter of 09/18/16  . ED EKG within 10 minutes  . ED EKG within 10 minutes  . EKG 12-Lead  . EKG 12-Lead  . ED EKG  . ED EKG    IMPRESSION AND PLAN: Patient is 76 year old presenting with chest pain  1. Chest pain appears to be atypical However has multiple risk factors We'll place under observation obtain a stress test in the morning Continue aspirin I will place her on PPI  2. Back discomfort with burning Could be related to nerve compression I will order a cervical spine MRI Check a B12 level  3. Essential hypertension Continue hydrochlorothiazide and Altace  4. Lipidemia nonspecified continue Lipitor  5. Miscellaneous Lovenox for DVT prophylaxis    All the records are reviewed and case discussed with ED provider. Management plans discussed with the patient, family and they are in agreement.  CODE STATUS: Code Status History    This patient does not have a recorded code status. Please follow your organizational policy for patients in this situation.    Advance Directive Documentation     Most Recent Value  Type of Advance Directive  Living will  Pre-existing out of facility DNR order (yellow form or pink MOST form)  -  "MOST" Form in Place?  -       TOTAL TIME TAKING CARE OF THIS PATIENT:68minutes.    Dustin Flock M.D on 09/18/2016 at 1:43  PM  Between 7am to 6pm - Pager - [phone]  After 6pm go to [email or web address] - password [email or web address] [facility]  Office   [phone]  CC: Primary care physician; Precious Reel, MD

## 2016-09-18 NOTE — Progress Notes (Signed)
Patient called out with complaints of pain in his chest and between his shoulder blades, stated that it started after he ate his dinner. Upon assessment of the patient, pain had resided. PRN Maalox given to patient, VSS. Text paged MD for PRN pain medication as nothing is ordered.

## 2016-09-18 NOTE — ED Notes (Signed)
Ambulated to restroom. NAD.

## 2016-09-19 ENCOUNTER — Observation Stay (HOSPITAL_BASED_OUTPATIENT_CLINIC_OR_DEPARTMENT_OTHER): Payer: Medicare Other

## 2016-09-19 ENCOUNTER — Encounter: Payer: Self-pay | Admitting: Radiology

## 2016-09-19 DIAGNOSIS — R079 Chest pain, unspecified: Secondary | ICD-10-CM

## 2016-09-19 LAB — NM MYOCAR MULTI W/SPECT W/WALL MOTION / EF
CHL CUP NUCLEAR SRS: 0
CHL CUP NUCLEAR SSS: 0
LV dias vol: 45 mL (ref 62–150)
LVSYSVOL: 6 mL
NUC STRESS TID: 0.74
Peak HR: 125 {beats}/min
Percent HR: 86 %
Rest HR: 77 {beats}/min
SDS: 0

## 2016-09-19 LAB — LIPID PANEL
Cholesterol: 121 mg/dL (ref 0–200)
HDL: 31 mg/dL — AB (ref >40)
LDL Cholesterol: 62 mg/dL (ref 0–99)
Total CHOL/HDL Ratio: 3.9 RATIO
Triglycerides: 141 mg/dL (ref <150)
VLDL: 28 mg/dL (ref 0–40)

## 2016-09-19 LAB — TROPONIN I

## 2016-09-19 MED ORDER — OXYCODONE-ACETAMINOPHEN 5-325 MG PO TABS: 1.0000 | ORAL_TABLET | Freq: Four times a day (QID) | ORAL | 0 refills | Status: DC | PRN

## 2016-09-19 MED ORDER — TECHNETIUM TC 99M TETROFOSMIN IV KIT
30.0000 | PACK | Freq: Once | INTRAVENOUS | Status: AC | PRN
  Administered 2016-09-19: 31.505 via INTRAVENOUS

## 2016-09-19 MED ORDER — TECHNETIUM TC 99M TETROFOSMIN IV KIT
13.0000 | PACK | Freq: Once | INTRAVENOUS | Status: AC | PRN
  Administered 2016-09-19: 13.198 via INTRAVENOUS

## 2016-09-19 MED ORDER — OXYCODONE-ACETAMINOPHEN 5-325 MG PO TABS: 1.0000 | ORAL_TABLET | Freq: Four times a day (QID) | ORAL | Status: DC | PRN

## 2016-09-19 MED ORDER — REGADENOSON 0.4 MG/5ML IV SOLN
0.4000 mg | Freq: Once | INTRAVENOUS | Status: AC
  Administered 2016-09-19: 0.4 mg via INTRAVENOUS

## 2016-09-19 NOTE — Progress Notes (Signed)
Discharge paperwork reviewed with patient. Information regarding follow-up appointments, medications and education for all newly prescribed meds was provided, all questions answered. Peripheral IV removed, catheter intact. Heart monitor removed, returned to the nurse station. Transport requested via wheelchair to the lobby for discharge.    

## 2016-09-19 NOTE — Discharge Summary (Signed)
[facility], 76 y.o., DOB 05-08-1941, MRN [id number]. Admission date: 09/18/2016 Discharge Date 09/19/2016 Primary MD Precious Reel, MD Admitting Physician Dustin Flock, MD  Admission Diagnosis  Back pain [M54.9] Chest pain [R07.9] Moderate risk chest pain [R07.9]  Discharge Diagnosis   Active Problems:   Chest pain with negative stress test   Back pain due to cervical disc disease   Hyperlipemia    hypertention   Ischemia optic neuropathy of right eye         Hospital Course  Khaliq Turay  is a 76 y.o. male with a known history of Essential hypertension, hyperlipidemia who is presenting to the emergency room with complaint of left-sided and substernal chest pain. Patient reports that for the past few days he's been having this chest discomfort. Described as sharp type of pain worse. Patient also had back pain therefore underwent cervical spine MRI which is showing that he has cervical spine disease and a large bone spur with disc herniation. He needs to be followed up with spine specialist.  His chest pain he underwent stress test which was negative for any ischemia or infarct.             Consults  None  Significant Tests:  See full reports for all details     Dg Chest 2 View  Result Date: 09/18/2016 CLINICAL DATA:  Chest pain. EXAM: CHEST  2 VIEW COMPARISON:  01/24/2004. FINDINGS: Mediastinum hilar structures are normal. No focal infiltrate. No pleural effusion or pneumothorax. Degenerative changes thoracic spine again noted. IMPRESSION: No acute cardiopulmonary disease. Electronically Signed   By: Marcello Moores  Register   On: 09/18/2016 11:45   Mr Cervical Spine Wo Contrast  Result Date: 09/18/2016 CLINICAL DATA:  Two week history of abnormal burning feeling in chest and back. Pain extending into LEFT arm with tingling. EXAM: MRI CERVICAL SPINE WITHOUT CONTRAST TECHNIQUE: Multiplanar, multisequence MR imaging of the cervical  spine was performed. No intravenous contrast was administered. COMPARISON:  Cervical spine radiographs 09/27/2012. FINDINGS: Alignment: Physiologic except for 2 mm facet mediated anterolisthesis C7-T1. Vertebrae: No worrisome osseous lesion. Cord: Cord flattening without abnormal cord signal most pronounced at C3-C4. Posterior Fossa, vertebral arteries, paraspinal tissues: Unremarkable. Disc levels: C2-3: Unremarkable disc space. BILATERAL facet arthropathy. No C3 foraminal narrowing. C3-4: Central protrusion. RIGHT greater than LEFT uncinate spurring. Facet arthropathy with RIGHT-sided joint effusion. Ligamentum flavum hypertrophy. Cord flattening. Canal diameter 6 7 mm. RIGHT greater than LEFT C4 foraminal narrowing. C4-5: Annular bulge. Severe LEFT-sided facet arthropathy with bony overgrowth. LEFT C5 foraminal narrowing is compounded by uncinate spurring. C5-6: Central protrusion. BILATERAL facet arthropathy, worse on the RIGHT. RIGHT C6 foraminal narrowing. C6-7: Unremarkable disc space. Severe LEFT and mild RIGHT facet arthropathy. LEFT C7 foraminal narrowing is possible. C7-T1: 2 mm anterolisthesis. Central protrusion. Facet arthropathy. No definite impingement. IMPRESSION: Multilevel spondylosis as described. Potentially symptomatic LEFT-sided symptomatology could result from disc disease, uncinate spurring, and/or facet arthropathy, most pronounced at C4-5, and C6-7. See discussion above. Electronically Signed   By: Staci Righter M.D.   On: 09/18/2016 15:20   Nm Myocar Multi W/spect W/wall Motion / Ef  Result Date: 09/19/2016  The study is normal.  This is a low risk study.  The left ventricular ejection fraction is hyperdynamic (>65%).        Today   Subjective:   Lina Sar   No cp c/o back pain  Objective:   Blood pressure (!) 106/92, pulse  95, temperature 97.8 F (36.6 C), temperature source Oral, resp. rate 16, height 5\' 6"  (1.676 m), weight 152 lb (68.9 kg), SpO2 97 %.   .  Intake/Output Summary (Last 24 hours) at 09/19/16 1312 Last data filed at 09/19/16 1000  Gross per 24 hour  Intake                0 ml  Output             1150 ml  Net            -1150 ml    Exam VITAL SIGNS: Blood pressure (!) 106/92, pulse 95, temperature 97.8 F (36.6 C), temperature source Oral, resp. rate 16, height 5\' 6"  (1.676 m), weight 152 lb (68.9 kg), SpO2 97 %.  GENERAL:  76 y.o.-year-old patient lying in the bed with no acute distress.  EYES: Pupils equal, round, reactive to light and accommodation. No scleral icterus. Extraocular muscles intact.  HEENT: Head atraumatic, normocephalic. Oropharynx and nasopharynx clear.  NECK:  Supple, no jugular venous distention. No thyroid enlargement, no tenderness.  LUNGS: Normal breath sounds bilaterally, no wheezing, rales,rhonchi or crepitation. No use of accessory muscles of respiration.  CARDIOVASCULAR: S1, S2 normal. No murmurs, rubs, or gallops.  ABDOMEN: Soft, nontender, nondistended. Bowel sounds present. No organomegaly or mass.  EXTREMITIES: No pedal edema, cyanosis, or clubbing.  NEUROLOGIC: Cranial nerves II through XII are intact. Muscle strength 5/5 in all extremities. Sensation intact. Gait not checked.  PSYCHIATRIC: The patient is alert and oriented x 3.  SKIN: No obvious rash, lesion, or ulcer.   Data Review     CBC w Diff: Lab Results  Component Value Date   WBC 6.3 09/18/2016   HGB 15.5 09/18/2016   HCT 44.2 09/18/2016   PLT 189 09/18/2016   CMP: Lab Results  Component Value Date   NA 137 09/18/2016   K 4.1 09/18/2016   CL 103 09/18/2016   CO2 28 09/18/2016   BUN 16 09/18/2016   CREATININE 1.16 09/18/2016  .  Micro Results No results found for this or any previous visit (from the past 240 hour(s)).      Code Status Orders        Start     Ordered   09/18/16 1534  Full code  Continuous     09/18/16 1533    Code Status History    Date Active Date Inactive Code Status Order ID Comments  User Context   This patient has a current code status but no historical code status.    Advance Directive Documentation     Most Recent Value  Type of Advance Directive  Living will  Pre-existing out of facility DNR order (yellow form or pink MOST form)  -  "MOST" Form in Place?  -          Follow-up Information    RUSSO,JOHN M, MD. Schedule an appointment as soon as possible for a visit in 1 week.   Specialty:  Internal Medicine Contact information: [place] [address] [phone]           Discharge Medications   Allergies as of 09/19/2016   No Known Allergies     Medication List    TAKE these medications   aspirin 325 MG tablet Take 325 mg by mouth daily.   atorvastatin 10 MG tablet Commonly known as:  LIPITOR Take 10 mg by mouth daily.   FOLPLEX 2.2 2.2-25-0.5 MG Tabs Generic drug:  Folic Acid-Vit [id number] O11 Take 1 tablet by mouth daily.   hydrochlorothiazide 12.5 MG capsule Commonly known as:  MICROZIDE Take 12.5 mg by mouth daily.   oxyCODONE-acetaminophen 5-325 MG tablet Commonly known as:  PERCOCET/ROXICET Take 1 tablet by mouth every 6 (six) hours as needed for moderate pain.   PRESERVISION AREDS 2 PO Take by mouth 2 (two) times daily.   ramipril 10 MG capsule Commonly known as:  ALTACE Take 10 mg by mouth daily.          Total Time in preparing paper work, data evaluation and todays exam - 35 minutes  Dustin Flock M.D on 09/19/2016 at 1:12 PM  [facility]   Office  [phone]

## 2016-09-19 NOTE — Discharge Instructions (Signed)
[facility] [place] [facility] ° °DIET:  °Cardiac diet ° °DISCHARGE CONDITION:  °Stable ° °ACTIVITY:  °Activity as tolerated ° °OXYGEN:  °Home Oxygen: No. °  °Oxygen Delivery: room air ° °DISCHARGE LOCATION:  °home  ° ° °ADDITIONAL DISCHARGE INSTRUCTION: ° ° °If you experience worsening of your admission symptoms, develop shortness of breath, life threatening emergency, suicidal or homicidal thoughts you must seek medical attention immediately by calling 911 or calling your MD immediately  if symptoms less severe. ° °You Must read complete instructions/literature along with all the possible adverse reactions/side effects for all the Medicines you take and that have been prescribed to you. Take any new Medicines after you have completely understood and accpet all the possible adverse reactions/side effects.  ° °Please note ° °You were cared for by a hospitalist during your hospital stay. If you have any questions about your discharge medications or the care you received while you were in the hospital after you are discharged, you can call the unit and asked to speak with the hospitalist on call if the hospitalist that took care of you is not available. Once you are discharged, your primary care physician will handle any further medical issues. Please note that NO REFILLS for any discharge medications will be authorized once you are discharged, as it is imperative that you return to your primary care physician (or establish a relationship with a primary care physician if you do not have one) for your aftercare needs so that they can reassess your need for medications and monitor your lab values. ° ° °

## 2016-09-19 NOTE — Plan of Care (Signed)
Problem: Pain Managment: Goal: General experience of comfort will improve Outcome: Progressing No complaints of pain at this time, will continue to reassess.

## 2016-09-21 ENCOUNTER — Encounter: Payer: Self-pay | Admitting: Physician Assistant

## 2016-09-22 ENCOUNTER — Ambulatory Visit (INDEPENDENT_AMBULATORY_CARE_PROVIDER_SITE_OTHER): Payer: Medicare Other | Admitting: Physician Assistant

## 2016-09-22 ENCOUNTER — Encounter: Payer: Self-pay | Admitting: Physician Assistant

## 2016-09-22 VITALS — BP 120/68 | HR 93 | Ht 66.0 in | Wt 158.0 lb

## 2016-09-22 DIAGNOSIS — E784 Other hyperlipidemia: Secondary | ICD-10-CM

## 2016-09-22 DIAGNOSIS — E7849 Other hyperlipidemia: Secondary | ICD-10-CM

## 2016-09-22 DIAGNOSIS — R0789 Other chest pain: Secondary | ICD-10-CM

## 2016-09-22 DIAGNOSIS — I10 Essential (primary) hypertension: Secondary | ICD-10-CM

## 2016-09-22 NOTE — Progress Notes (Signed)
Cardiology Office Note    Date:  09/22/2016   ID:  Kenneth, Murillo Nov 04, 1940, MRN [id number]  PCP:  Precious Reel, MD  Cardiologist:  New to [facility]  Chief Complaint: Chest pain   History of Present Illness:   [name] is a 76 y.o. male with Hx of HTN, HLD and R eye macular degeneration (takes ASA 325mg  for this)  who presents for chest pain.  Patient was seen by PCP 08/24/16  for chest pain and SOB. Felt atypical. EKG reassuring. CXR clear. Referred to cardiologist for further evaluation.   The patient has intermittent sharp and burning pain across his shoulder blade. His sharp pain lasts for a few minutes and burning sensation lasts for approximately 30 minutes. This occasionally radiates to his left shoulder and across axillary area to his chest. Last Friday 09/18/16 he had a worse episode and admitted to [facility]. Troponin x 3 negative. Shows normal sinus rhythm at rate of 61 bpm. Unchanged from EKG of PCP office. Personally reviewed. Stress test was low risk.  MRI of cervical spine is abnormal:  Multilevel spondylosis as described. Potentially symptomatic LEFT-sided symptomatology could result from disc disease, uncinate spurring, and/or facet arthropathy, most pronounced at C4-5, and C6-7. See discussion above. She was discharge with cardiology and neurosurgery follow up.   His symptoms are similar when he has cervical disc issue in 2014. Walks about 30 minutes with dog without chest pain or dyspnea. The patient denies nausea, vomiting, fever, chest pain, palpitations, shortness of breath, orthopnea, PND, dizziness, syncope, cough, congestion, abdominal pain, hematochezia, melena, lower extremity edema.   Past Medical History:  Diagnosis Date  . Cataract   . Hyperlipidemia   . Hypertension   . Ischemic optic neuropathy of right eye    anterior  . Lumbar herniated disc     Past Surgical History:  Procedure Laterality Date  . CATARACT EXTRACTION     11/01/06-left  eye    12/17/09-right eye  . kidney stones     lithotripsy done  . TONSILLECTOMY      Current Medications: Prior to Admission medications   Medication Sig Start Date End Date Taking? Authorizing Provider  aspirin 325 MG tablet Take 325 mg by mouth daily.    Historical Provider, MD  atorvastatin (LIPITOR) 10 MG tablet Take 10 mg by mouth daily.    Historical Provider, MD  FOLPLEX 2.2 2.2-25-0.5 MG TABS Take 1 tablet by mouth daily. 08/29/16   Historical Provider, MD  hydrochlorothiazide (MICROZIDE) 12.5 MG capsule Take 12.5 mg by mouth daily.    Historical Provider, MD  Multiple Vitamins-Minerals (PRESERVISION AREDS 2 PO) Take by mouth 2 (two) times daily.    Historical Provider, MD  oxyCODONE-acetaminophen (PERCOCET/ROXICET) 5-325 MG tablet Take 1 tablet by mouth every 6 (six) hours as needed for moderate pain. 09/19/16   Dustin Flock, MD  ramipril (ALTACE) 10 MG capsule Take 10 mg by mouth daily.    Historical Provider, MD    Allergies:   Patient has no known allergies.   Social History   Social History  . Marital status: Married    Spouse name: N/A  . Number of children: N/A  . Years of education: N/A   Social History Main Topics  . Smoking status: Never Smoker  . Smokeless tobacco: Never Used  . Alcohol use No  . Drug use: No  . Sexual activity: Not Asked   Other Topics Concern  . None   Social History Narrative  .  None     Family History:  The patient's family history includes CVA in his mother; Cancer - Other in his father; Colon cancer in his sister.   ROS:   Please see the history of present illness.    ROS All other systems reviewed and are negative.   PHYSICAL EXAM:   VS:  BP 120/68 (BP Location: Left Arm, Cuff Size: Normal)   Pulse 93   Ht 5\' 6"  (1.676 m)   Wt 158 lb (71.7 kg)   BMI 25.50 kg/m    GEN: Well nourished, well developed, in no acute distress  HEENT: normal  Neck: no JVD, carotid bruits, or masses Cardiac: RRR; no murmurs, rubs, or  gallops,no edema  Respiratory:  clear to auscultation bilaterally, normal work of breathing GI: soft, nontender, nondistended, + BS MS: no deformity or atrophy  Skin: warm and dry, no rash Neuro:  Alert and Oriented x 3, Strength and sensation are intact Psych: euthymic mood, full affect  Wt Readings from Last 3 Encounters:  09/22/16 158 lb (71.7 kg)  09/18/16 152 lb (68.9 kg)  07/16/15 160 lb (72.6 kg)      Studies/Labs Reviewed:   EKG:  EKG is not ordered today.    Recent Labs: 09/18/2016: BUN 16; Creatinine, Ser 1.16; Hemoglobin 15.5; Platelets 189; Potassium 4.1; Sodium 137   Lipid Panel    Component Value Date/Time   CHOL 121 09/19/2016 0327   TRIG 141 09/19/2016 0327   HDL 31 (L) 09/19/2016 0327   CHOLHDL 3.9 09/19/2016 0327   VLDL 28 09/19/2016 0327   LDLCALC 62 09/19/2016 0327    Additional studies/ records that were reviewed today include:   As above    ASSESSMENT & PLAN:    1. Chest pain  - Atypical. Occur occasionally if radiates from back. Myoview low risk. Troponin negative in hospital. EKG reassuring. Discussed with DOD Dr. Tamala Julian (did not saw the patient). He will be cleared for surgery if required with low cardiac risk assuming not worsening of symptoms. Follow up PRN. He preferred to follow with Dr. Angelena Form in future if required.   2. HTN - Stable and well controlled on current medication.   3. HLD - 09/19/2016: Cholesterol 121; HDL 31; LDL Cholesterol 62; Triglycerides 141; VLDL 28  - Continue statin      Medication Adjustments/Labs and Tests Ordered: Current medicines are reviewed at length with the patient today.  Concerns regarding medicines are outlined above.  Medication changes, Labs and Tests ordered today are listed in the Patient Instructions below. Patient Instructions  Medication Instructions:  Your physician recommends that you continue on your current medications as directed. Please refer to the Current Medication list given to  you today.  Labwork: None ordered  Testing/Procedures:   Follow-Up: Your physician recommends that you schedule a follow-up appointment in: AS NEEDED   Any Other Special Instructions Will Be Listed Below (If Applicable).     If you need a refill on your cardiac medications before your next appointment, please call your pharmacy.      Jarrett Soho, [place]  09/22/2016 3:18 PM    [facility] [address], [place], [place]  [address] Phone: [phone]; Fax: [phone]

## 2016-09-22 NOTE — Patient Instructions (Addendum)
Medication Instructions:  Your physician recommends that you continue on your current medications as directed. Please refer to the Current Medication list given to you today.   Labwork: None ordered  Testing/Procedures:   Follow-Up: Your physician recommends that you schedule a follow-up appointment in: AS NEEDED   Any Other Special Instructions Will Be Listed Below (If Applicable).     If you need a refill on your cardiac medications before your next appointment, please call your pharmacy.   

## 2017-08-10 ENCOUNTER — Other Ambulatory Visit: Payer: Self-pay | Admitting: Internal Medicine

## 2017-08-10 DIAGNOSIS — R1310 Dysphagia, unspecified: Secondary | ICD-10-CM

## 2017-08-11 ENCOUNTER — Other Ambulatory Visit: Payer: Medicare Other

## 2017-08-13 ENCOUNTER — Ambulatory Visit
Admission: RE | Admit: 2017-08-13 | Discharge: 2017-08-13 | Disposition: A | Discharge status: Home / Self Care | Payer: Medicare Other | Source: Ambulatory Visit | Attending: Internal Medicine | Admitting: Internal Medicine

## 2017-08-13 DIAGNOSIS — R1310 Dysphagia, unspecified: Secondary | ICD-10-CM

## 2018-05-01 ENCOUNTER — Emergency Department: Payer: Medicare Other

## 2018-05-01 ENCOUNTER — Encounter: Payer: Self-pay | Admitting: Intensive Care

## 2018-05-01 ENCOUNTER — Emergency Department
Admission: EM | Admit: 2018-05-01 | Discharge: 2018-05-01 | Disposition: A | Discharge status: Home / Self Care | Payer: Medicare Other | Attending: Emergency Medicine | Admitting: Emergency Medicine

## 2018-05-01 ENCOUNTER — Other Ambulatory Visit: Payer: Self-pay

## 2018-05-01 DIAGNOSIS — R791 Abnormal coagulation profile: Secondary | ICD-10-CM | POA: Diagnosis not present

## 2018-05-01 DIAGNOSIS — R531 Weakness: Secondary | ICD-10-CM | POA: Diagnosis not present

## 2018-05-01 DIAGNOSIS — R202 Paresthesia of skin: Secondary | ICD-10-CM | POA: Diagnosis present

## 2018-05-01 DIAGNOSIS — I10 Essential (primary) hypertension: Secondary | ICD-10-CM | POA: Insufficient documentation

## 2018-05-01 DIAGNOSIS — Z7982 Long term (current) use of aspirin: Secondary | ICD-10-CM | POA: Insufficient documentation

## 2018-05-01 LAB — DIFFERENTIAL
Abs Immature Granulocytes: 0.02 10*3/uL (ref 0.00–0.07)
BASOS ABS: 0 10*3/uL (ref 0.0–0.1)
Basophils Relative: 1 %
Eosinophils Absolute: 0.1 10*3/uL (ref 0.0–0.5)
Eosinophils Relative: 1 %
Immature Granulocytes: 0 %
LYMPHS ABS: 1.1 10*3/uL (ref 0.7–4.0)
LYMPHS PCT: 18 %
MONO ABS: 0.6 10*3/uL (ref 0.1–1.0)
MONOS PCT: 11 %
NEUTROS ABS: 4.1 10*3/uL (ref 1.7–7.7)
Neutrophils Relative %: 69 %

## 2018-05-01 LAB — CBC
HEMATOCRIT: 47.4 % (ref 39.0–52.0)
HEMOGLOBIN: 16.6 g/dL (ref 13.0–17.0)
MCH: 32.9 pg (ref 26.0–34.0)
MCHC: 35 g/dL (ref 30.0–36.0)
MCV: 94 fL (ref 80.0–100.0)
Platelets: 201 10*3/uL (ref 150–400)
RBC: 5.04 MIL/uL (ref 4.22–5.81)
RDW: 12.6 % (ref 11.5–15.5)
WBC: 5.9 10*3/uL (ref 4.0–10.5)
nRBC: 0 % (ref 0.0–0.2)

## 2018-05-01 LAB — COMPREHENSIVE METABOLIC PANEL
ALK PHOS: 102 U/L (ref 38–126)
ALT: 32 U/L (ref 0–44)
AST: 31 U/L (ref 15–41)
Albumin: 4.5 g/dL (ref 3.5–5.0)
Anion gap: 10 (ref 5–15)
BUN: 15 mg/dL (ref 8–23)
CALCIUM: 9.5 mg/dL (ref 8.9–10.3)
CO2: 28 mmol/L (ref 22–32)
Chloride: 99 mmol/L (ref 98–111)
Creatinine, Ser: 1.09 mg/dL (ref 0.61–1.24)
GFR calc non Af Amer: >60 mL/min (ref >60)
GLUCOSE: 155 mg/dL — AB (ref 70–99)
Potassium: 3.8 mmol/L (ref 3.5–5.1)
SODIUM: 137 mmol/L (ref 135–145)
Total Bilirubin: 1 mg/dL (ref 0.3–1.2)
Total Protein: 7.5 g/dL (ref 6.5–8.1)

## 2018-05-01 LAB — PROTIME-INR
INR: 1.16
Prothrombin Time: 14.7 seconds (ref 11.4–15.2)

## 2018-05-01 LAB — GLUCOSE, CAPILLARY: GLUCOSE-CAPILLARY: 144 mg/dL — AB (ref 70–99)

## 2018-05-01 LAB — TROPONIN I: Troponin I: <0.03 ng/mL (ref <0.03)

## 2018-05-01 LAB — APTT: aPTT: 37 seconds — ABNORMAL HIGH (ref 24–36)

## 2018-05-01 NOTE — ED Provider Notes (Signed)
[facility] Emergency Department Provider Note  ____________________________________________  Time seen: Approximately 2:34 PM  I have reviewed the triage vital signs and the nursing notes.   HISTORY  Chief Complaint Numbness    HPI [name] is a 77 y.o. male with a history of hypertension hyperlipidemia he reports waking up this morning with paresthesia and weakness of the right upper extremity as well as a paresthesia of the right side of the face.  He describes it as a "tightness", nonradiating.  No aggravating or alleviating factors.  Lasted for a few hours and then resolved on its own.  He currently feels back to normal without any difficulty at all.  Denies any vision changes head trauma fevers chills neck stiffness chest pain or shortness of breath.  He felt normal at bedtime last night.  He ate a sausage breakfast biscuit today.      Past Medical History:  Diagnosis Date  . Cataract   . Hyperlipidemia   . Hypertension   . Ischemic optic neuropathy of right eye    anterior  . Lumbar herniated disc      Patient Active Problem List   Diagnosis Date Noted  . Chest pain 09/18/2016     Past Surgical History:  Procedure Laterality Date  . CATARACT EXTRACTION     11/01/06-left eye    12/17/09-right eye  . kidney stones     lithotripsy done  . TONSILLECTOMY       Prior to Admission medications   Medication Sig Start Date End Date Taking? Authorizing Provider  aspirin 325 MG tablet Take 325 mg by mouth daily.   Yes [provider]  atorvastatin (LIPITOR) 10 MG tablet Take 10 mg by mouth daily.   Yes [provider]  FOLPLEX 2.2 2.2-25-0.5 MG TABS Take 1 tablet by mouth daily. 08/29/16  Yes [provider]  gabapentin (NEURONTIN) 100 MG capsule Take 300-400 mg by mouth 4 (four) times daily.   Yes [provider]  hydrochlorothiazide (MICROZIDE) 12.5 MG capsule Take 12.5 mg by mouth daily.   Yes [provider]  Multiple Vitamins-Minerals (PRESERVISION AREDS 2 PO) Take by mouth 2 (two) times daily.   Yes [provider]  ramipril (ALTACE) 10 MG capsule Take 10 mg by mouth daily.   Yes [provider]     Allergies Patient has no known allergies.   Family History  Problem Relation Age of Onset  . CVA Mother   . Cancer - Other Father   . Colon cancer Sister     Social History Social History   Tobacco Use  . Smoking status: Never Smoker  . Smokeless tobacco: Never Used  Substance Use Topics  . Alcohol use: No    Alcohol/week: 0.0 standard drinks  . Drug use: No    Review of Systems  Constitutional:   No fever or chills.  ENT:   No sore throat. No rhinorrhea. Cardiovascular:   No chest pain or syncope. Respiratory:   No dyspnea or cough. Gastrointestinal:   Negative for abdominal pain, vomiting and diarrhea.  Musculoskeletal:   Negative for focal pain or swelling All other systems reviewed and are negative except as documented above in ROS and HPI.  ____________________________________________   PHYSICAL EXAM:  VITAL SIGNS: ED Triage Vitals  Enc Vitals Group     BP 05/01/18 1030 (!) 209/94     Pulse Rate 05/01/18 1030 66     Resp 05/01/18 1030 18  Temp 05/01/18 1030 97.9 F (36.6 C)     Temp Source 05/01/18 1030 Oral     SpO2 05/01/18 1030 99 %     Weight 05/01/18 1031 155 lb (70.3 kg)     Height 05/01/18 1031 5\' 6"  (1.676 m)     Head Circumference --      Peak Flow --      Pain Score 05/01/18 1031 2     Pain Loc --      Pain Edu? --      Excl. in [place]? --     Vital signs reviewed, nursing assessments reviewed.   Constitutional:   Alert and oriented. Non-toxic appearance. Eyes:   Conjunctivae are normal. EOMI. PERRL. ENT      Head:   Normocephalic and atraumatic.      Nose:   No congestion/rhinnorhea.       Mouth/Throat:   MMM, no pharyngeal erythema. No peritonsillar mass.       Neck:   No meningismus. Full  ROM. Hematological/Lymphatic/Immunilogical:   No cervical lymphadenopathy. Cardiovascular:   RRR. Symmetric bilateral radial and DP pulses.  No murmurs. Cap refill less than 2 seconds. Respiratory:   Normal respiratory effort without tachypnea/retractions. Breath sounds are clear and equal bilaterally. No wheezes/rales/rhonchi. Gastrointestinal:   Soft and nontender. Non distended. There is no CVA tenderness.  No rebound, rigidity, or guarding. Genitourinary:   deferred Musculoskeletal:   Normal range of motion in all extremities. No joint effusions.  No lower extremity tenderness.  No edema. Neurologic:   Normal speech and language Cranial nerves III through XII intact.  Motor grossly intact. No pronator drift.  Normal gait. Symmetric sensation NIH stroke scale 0 No acute focal neurologic deficits are appreciated.  Skin:    Skin is warm, dry and intact. No rash noted.  No petechiae, purpura, or bullae.  ____________________________________________    LABS (pertinent positives/negatives) (all labs ordered are listed, but only abnormal results are displayed) Labs Reviewed  APTT - Abnormal; Notable for the following components:      Result Value   aPTT 37 (*)    All other components within normal limits  COMPREHENSIVE METABOLIC PANEL - Abnormal; Notable for the following components:   Glucose, Bld 155 (*)    All other components within normal limits  GLUCOSE, CAPILLARY - Abnormal; Notable for the following components:   Glucose-Capillary 144 (*)    All other components within normal limits  PROTIME-INR  CBC  DIFFERENTIAL  TROPONIN I  CBG MONITORING, ED   ____________________________________________   EKG  Interpreted by me  Date: 05/01/2018  Rate: 74  Rhythm: normal sinus rhythm  QRS Axis: normal  Intervals: normal  ST/T Wave abnormalities: normal  Conduction Disutrbances: none  Narrative Interpretation:  unremarkable      ____________________________________________    RADIOLOGY  Ct Head Wo Contrast  Result Date: 05/01/2018 CLINICAL DATA:  Weakness in the right arm for 2 days EXAM: CT HEAD WITHOUT CONTRAST TECHNIQUE: Contiguous axial images were obtained from the base of the skull through the vertex without intravenous contrast. COMPARISON:  MRI from 06/03/2014 FINDINGS: Brain: Mild atrophic changes are noted commensurate with the patient's given age. No findings to suggest acute hemorrhage, acute infarction or space-occupying mass lesion are noted. Vascular: No hyperdense vessel or unexpected calcification. Skull: Normal. Negative for fracture or focal lesion. Sinuses/Orbits: No acute finding. Other: None. IMPRESSION: Mild atrophic changes without acute abnormality. Electronically Signed   By: Inez Catalina M.D.   On: 05/01/2018  11:00   Mr Brain 68 Contrast  Result Date: 05/01/2018 CLINICAL DATA:  77 year old male with numbness pain and weakness in the right arm and head last night. EXAM: MRI HEAD WITHOUT CONTRAST TECHNIQUE: Multiplanar, multiecho pulse sequences of the brain and surrounding structures were obtained without intravenous contrast. COMPARISON:  Head CT without contrast earlier today. Brain MRI 06/03/2014. FINDINGS: Brain: Cerebral volume is within normal limits for age. No restricted diffusion to suggest acute infarction. No midline shift, mass effect, evidence of mass lesion, ventriculomegaly, extra-axial collection or acute intracranial hemorrhage. Cervicomedullary junction and pituitary are within normal limits. Stable and normal for age gray and white matter signal throughout the brain. No chronic cerebral blood products. Vascular: Major intracranial vascular flow voids are stable since 2015. Skull and upper cervical spine: Negative for age visible cervical spine. Normal bone marrow signal. Sinuses/Orbits: Stable and negative; chronic postoperative changes to both globes. Other:  Mastoids remain clear. Visible internal auditory structures appear normal. Scalp and face soft tissues appear negative. IMPRESSION: No acute intracranial abnormality. MRI Brain appears stable since 2015 and normal for age. Electronically Signed   By: Genevie Ann M.D.   On: 05/01/2018 14:51    ____________________________________________   PROCEDURES Procedures  ____________________________________________  DIFFERENTIAL DIAGNOSIS   Nerve impingement/muscular spasm, TIA/CVA  CLINICAL IMPRESSION / ASSESSMENT AND PLAN / ED COURSE  Pertinent labs & imaging results that were available during my care of the patient were reviewed by me and considered in my medical decision making (see chart for details).      Clinical Course as of May 01 1529  Sun May 01, 2018  1125 P/w episode of RUE paresthesia and weakness, now resolved. NIHSS 0. Muscular / nerve impingement vs TIA/CVA. Will obtain MRI, if neg pt can be DC to pcp f/u. No e/o temporal arteritis.   [PS]  1528 MRI unremarkable.  No evidence of stroke.  Will discharge to follow-up with primary care for further evaluation.   [PS]    Clinical Course User Index [PS] Carrie Mew, MD     ____________________________________________   FINAL CLINICAL IMPRESSION(S) / ED DIAGNOSES    Final diagnoses:  Paresthesia     ED Discharge Orders    None      Portions of this note were generated with dragon dictation software. Dictation errors may occur despite best attempts at proofreading.    Carrie Mew, MD 05/01/18 [phone]

## 2018-05-01 NOTE — ED Notes (Signed)
Patient reports last night he had numbness, pain, weakness to right arm - he also reports that he had tightness and numbness in head - Reports at this time tightness in right side of head - No weakness in legs - No facial droop - Hand strength equal - Ambulatory with no change in gait - Denies vision changes from his norm

## 2018-05-01 NOTE — ED Triage Notes (Addendum)
Patient reports last night feeling numbness/pain/wakness in right arm and tightness and numbness to head. Reports minimal sharp pains on Right side of head and now only experiencing the tightness. No weakness in legs. No facial droop. Right hand grip is slightly weaker than left. Ambulatory with no problems. No vision changes (patient is legally blind in R eye)

## 2018-05-01 NOTE — ED Notes (Signed)
Per Dr Joni Fears no IV needed at this time

## 2018-05-01 NOTE — Discharge Instructions (Addendum)
Your CT scan and MRI of the brain today were unremarkable.  There is no sign of stroke.  Please follow-up with your doctor for continued evaluation of the symptoms.  Return to the ED if you have new or worsening symptoms.  Imaging reports are attached to these discharge instructions.

## 2018-05-01 NOTE — ED Notes (Signed)
Patient transported to MRI 

## 2018-05-23 ENCOUNTER — Other Ambulatory Visit: Payer: Self-pay | Admitting: Internal Medicine

## 2018-05-23 DIAGNOSIS — M5412 Radiculopathy, cervical region: Secondary | ICD-10-CM

## 2018-06-01 ENCOUNTER — Ambulatory Visit
Admission: RE | Admit: 2018-06-01 | Discharge: 2018-06-01 | Disposition: A | Discharge status: Home / Self Care | Payer: Medicare Other | Source: Ambulatory Visit | Attending: Internal Medicine | Admitting: Internal Medicine

## 2018-06-01 DIAGNOSIS — M5412 Radiculopathy, cervical region: Secondary | ICD-10-CM

## 2019-07-31 ENCOUNTER — Other Ambulatory Visit: Payer: Self-pay | Admitting: Internal Medicine

## 2019-07-31 DIAGNOSIS — H539 Unspecified visual disturbance: Secondary | ICD-10-CM

## 2019-07-31 DIAGNOSIS — M5412 Radiculopathy, cervical region: Secondary | ICD-10-CM

## 2019-07-31 DIAGNOSIS — R202 Paresthesia of skin: Secondary | ICD-10-CM

## 2019-08-01 ENCOUNTER — Ambulatory Visit
Admission: RE | Admit: 2019-08-01 | Discharge: 2019-08-01 | Disposition: A | Discharge status: Home / Self Care | Payer: Medicare PPO | Source: Ambulatory Visit | Attending: Internal Medicine | Admitting: Internal Medicine

## 2019-08-01 ENCOUNTER — Other Ambulatory Visit: Payer: Self-pay

## 2019-08-01 DIAGNOSIS — H539 Unspecified visual disturbance: Secondary | ICD-10-CM | POA: Insufficient documentation

## 2019-08-01 DIAGNOSIS — R202 Paresthesia of skin: Secondary | ICD-10-CM | POA: Diagnosis present

## 2019-08-01 DIAGNOSIS — M5412 Radiculopathy, cervical region: Secondary | ICD-10-CM | POA: Insufficient documentation

## 2019-08-17 ENCOUNTER — Telehealth: Payer: Self-pay | Admitting: Neurology

## 2019-08-17 NOTE — Telephone Encounter (Signed)
Patient is being referred to our office for Facial Paresthesia. He has a urgent referral and is requesting to see you because you have seen his son in the past. Can you please review and advise if we can see the patient with you sooner than April?  Thank you

## 2019-08-22 ENCOUNTER — Ambulatory Visit: Payer: Medicare PPO | Admitting: Neurology

## 2019-08-22 ENCOUNTER — Ambulatory Visit: Payer: Medicare PPO | Admitting: Diagnostic Neuroimaging

## 2019-08-22 ENCOUNTER — Other Ambulatory Visit: Payer: Self-pay

## 2019-08-22 ENCOUNTER — Encounter: Payer: Self-pay | Admitting: Neurology

## 2019-08-22 VITALS — BP 110/70 | HR 86 | Temp 97.7°F | Ht 66.0 in | Wt 162.5 lb

## 2019-08-22 DIAGNOSIS — H349 Unspecified retinal vascular occlusion: Secondary | ICD-10-CM | POA: Insufficient documentation

## 2019-08-22 DIAGNOSIS — M4802 Spinal stenosis, cervical region: Secondary | ICD-10-CM | POA: Diagnosis not present

## 2019-08-22 DIAGNOSIS — R200 Anesthesia of skin: Secondary | ICD-10-CM | POA: Diagnosis not present

## 2019-08-22 DIAGNOSIS — M47812 Spondylosis without myelopathy or radiculopathy, cervical region: Secondary | ICD-10-CM

## 2019-08-22 DIAGNOSIS — M542 Cervicalgia: Secondary | ICD-10-CM | POA: Insufficient documentation

## 2019-08-22 NOTE — Progress Notes (Signed)
[facility]  PATIENT: JULION RIGOLI DOB: 11-17-1940  REFERRING DOCTOR OR PCP: Shon Baton, MD SOURCE: Patient, notes from Dr. Virgina Jock, imaging reports, MRI images personally reviewed.  _________________________________   HISTORICAL  CHIEF COMPLAINT:  Chief Complaint  Patient presents with  . New Patient (Initial Visit)    RM 13 with wife(temp: 97.3). Paper referral from Dr. Virgina Jock for right facial numbness.     HISTORY OF PRESENT ILLNESS:  I had the pleasure of seeing your patient, Queen Weyman, at [facility] for neurologic consultation regarding his right facial numbness.  Mr. Wingerter is a 79 year old man who had the onset episodes of a tight sensation in his neck and occiput followed by numbness and a sensation like ants crawling on the scalp in the back of his head.   The first episode was about 3 weeks ago.    The episodes come and go and last about 30-60 seconds.    If her turns his head to the right, he sometimes has a similar sensation in the left side only.    In 2019, he had similar fleeting sensory symptoms that radiated into his arms.    He was placed on gabapentin 300  - 300 - 400 mg (1000 mg total).  After the results of his recent cervical spine MRI, he increased to 1200 mg  He had a retinal artery occlusion in 2015 and has had severe visual loss since.  MR angiogram was normal at that time.        I personally reviewed the MRI of the brain and cervical spine dated 08/01/2019.  The MRI of the brain was completely normal for age with just a couple small T2/FLAIR hyperintense foci in the hemispheres consistent with age-related chronic microvascular ischemic change.  Facial nerves were not distorted and they appeared normal.  There were no adjacent vascular loops.  The MRI of the cervical spine shows multilevel degenerative changes but no spinal cord pathology.  At C3-C4, there is moderate spinal stenosis and right greater than left foraminal  narrowing, at C4-C5, there is moderate spinal stenosis and moderate bilateral foraminal narrowing.  At C5-C6, there is mild spinal stenosis and mild foraminal narrowing.  REVIEW OF SYSTEMS: Constitutional: No fevers, chills, sweats, or change in appetite Eyes: No visual changes, double vision, eye pain Ear, nose and throat: No hearing loss, ear pain, nasal congestion, sore throat Cardiovascular: No chest pain, palpitations Respiratory: No shortness of breath at rest or with exertion.   No wheezes GastrointestinaI: No nausea, vomiting, diarrhea, abdominal pain, fecal incontinence Genitourinary: No dysuria, urinary retention or frequency.  No nocturia. Musculoskeletal: No neck pain, back pain Integumentary: No rash, pruritus, skin lesions Neurological: as above Psychiatric: No depression at this time.  No anxiety Endocrine: No palpitations, diaphoresis, change in appetite, change in weigh or increased thirst Hematologic/Lymphatic: No anemia, purpura, petechiae. Allergic/Immunologic: No itchy/runny eyes, nasal congestion, recent allergic reactions, rashes  ALLERGIES: No Known Allergies  HOME MEDICATIONS:  Current Outpatient Medications:  .  aspirin 325 MG tablet, Take 325 mg by mouth daily., Disp: , Rfl:  .  atorvastatin (LIPITOR) 10 MG tablet, Take 10 mg by mouth daily., Disp: , Rfl:  .  FOLPLEX 2.2 2.2-25-0.5 MG TABS, Take 1 tablet by mouth daily., Disp: , Rfl: 0 .  gabapentin (NEURONTIN) 100 MG capsule, Take 300-400 mg by mouth 4 (four) times daily., Disp: , Rfl:  .  hydrochlorothiazide (MICROZIDE) 12.5 MG capsule, Take 12.5 mg by mouth daily., Disp: ,  Rfl:  .  Multiple Vitamins-Minerals (PRESERVISION AREDS 2 PO), Take by mouth 2 (two) times daily., Disp: , Rfl:  .  ramipril (ALTACE) 10 MG capsule, Take 10 mg by mouth daily., Disp: , Rfl:   PAST MEDICAL HISTORY: Past Medical History:  Diagnosis Date  . Cataract   . Hyperlipidemia   . Hypertension   . Ischemic optic  neuropathy of right eye    anterior  . Lumbar herniated disc     PAST SURGICAL HISTORY: Past Surgical History:  Procedure Laterality Date  . CATARACT EXTRACTION     11/01/06-left eye    12/17/09-right eye  . kidney stones     lithotripsy done  . TONSILLECTOMY      FAMILY HISTORY: Family History  Problem Relation Age of Onset  . CVA Mother   . Cancer - Other Father   . Colon cancer Sister     SOCIAL HISTORY:  Social History   Socioeconomic History  . Marital status: Married    Spouse name: Vaughan Basta   . Number of children: 1  . Years of education: 42  . Highest education level: Not on file  Occupational History  . Not on file  Tobacco Use  . Smoking status: Never Smoker  . Smokeless tobacco: Never Used  Substance and Sexual Activity  . Alcohol use: No    Alcohol/week: 0.0 standard drinks  . Drug use: No  . Sexual activity: Not on file  Other Topics Concern  . Not on file  Social History Narrative   Right handed    Lives with wife   Caffeine use: Dr Malachi Bonds daily   Social Determinants of Health   Financial Resource Strain:   . Difficulty of Paying Living Expenses: Not on file  Food Insecurity:   . Worried About Charity fundraiser in the Last Year: Not on file  . Ran Out of Food in the Last Year: Not on file  Transportation Needs:   . Lack of Transportation (Medical): Not on file  . Lack of Transportation (Non-Medical): Not on file  Physical Activity:   . Days of Exercise per Week: Not on file  . Minutes of Exercise per Session: Not on file  Stress:   . Feeling of Stress : Not on file  Social Connections:   . Frequency of Communication with Friends and Family: Not on file  . Frequency of Social Gatherings with Friends and Family: Not on file  . Attends Religious Services: Not on file  . Active Member of Clubs or Organizations: Not on file  . Attends Archivist Meetings: Not on file  . Marital Status: Not on file  Intimate Partner Violence:    . Fear of Current or Ex-Partner: Not on file  . Emotionally Abused: Not on file  . Physically Abused: Not on file  . Sexually Abused: Not on file     PHYSICAL EXAM  Vitals:   08/22/19 1424  BP: 110/70  Pulse: 86  Temp: 97.7 F (36.5 C)  SpO2: 96%  Weight: 162 lb 8 oz (73.7 kg)  Height: 5\' 6"  (1.676 m)    Body mass index is 26.23 kg/m.   General: The patient is well-developed and well-nourished and in no acute distress  HEENT:  Head is [place]/AT.  Sclera are anicteric.  Funduscopic exam shows mild right optic nerve pallor  Neck: No carotid bruits are noted.  His neck has a mildly reduced range of motion.  It is nontender at this time.Marland Kitchen  Cardiovascular: The heart has a regular rate and rhythm with a normal S1 and S2. There were no murmurs, gallops or rubs.    Skin: Extremities are without rash or  edema.   Neurologic Exam  Mental status: The patient is alert and oriented x 3 at the time of the examination. The patient has apparent normal recent and remote memory, with an apparently normal attention span and concentration ability.   Speech is normal.  Cranial nerves: Extraocular movements are full.  He has reduced visual acuity OD.  There is a right APD.  Facial symmetry is present. There is good facial sensation to soft touch bilaterally.Facial strength is normal.  Trapezius and sternocleidomastoid strength is normal. No dysarthria is noted.  The tongue is midline, and the patient has symmetric elevation of the soft palate. No obvious hearing deficits are noted.  Motor:  Muscle bulk is normal.   Tone is normal. Strength is  5 / 5 in all 4 extremities.   Sensory: Sensory testing is intact to pinprick, soft touch and vibration sensation in all 4 extremities.  Coordination: Cerebellar testing reveals good finger-nose-finger and heel-to-shin bilaterally.  Gait and station: Station is normal.   Gait is normal. Tandem gait is normal.   He has mild retropulsion.   Romberg is  negative.   Reflexes: Deep tendon reflexes are symmetric and normal bilaterally.   Plantar responses are flexor.    DIAGNOSTIC DATA (LABS, IMAGING, TESTING) - I reviewed patient records, labs, notes, testing and imaging myself where available.  Lab Results  Component Value Date   WBC 5.9 05/01/2018   HGB 16.6 05/01/2018   HCT 47.4 05/01/2018   MCV 94.0 05/01/2018   PLT 201 05/01/2018      Component Value Date/Time   NA 137 05/01/2018 1038   K 3.8 05/01/2018 1038   CL 99 05/01/2018 1038   CO2 28 05/01/2018 1038   GLUCOSE 155 (H) 05/01/2018 1038   BUN 15 05/01/2018 1038   CREATININE 1.09 05/01/2018 1038   CALCIUM 9.5 05/01/2018 1038   PROT 7.5 05/01/2018 1038   ALBUMIN 4.5 05/01/2018 1038   AST 31 05/01/2018 1038   ALT 32 05/01/2018 1038   ALKPHOS 102 05/01/2018 1038   BILITOT 1.0 05/01/2018 1038   GFRNONAA >60 05/01/2018 1038   GFRAA >60 05/01/2018 1038   Lab Results  Component Value Date   CHOL 121 09/19/2016   HDL 31 (L) 09/19/2016   LDLCALC 62 09/19/2016   TRIG 141 09/19/2016   CHOLHDL 3.9 09/19/2016   No results found for: HGBA1C Lab Results  Component Value Date   [id number] 09/18/2016   No results found for: TSH     ASSESSMENT AND PLAN  Numbness  Cervical spinal stenosis  Cervical facet joint syndrome  Neck pain  Retinal artery occlusion  In summary, Mr. Koltes is a 79 year old man who has had multiple transient episodes of discomfort and numbness mostly in the occipital region.  I reviewed the MRIs of the brain and cervical spine in the presence of Mr. and Mrs. Delsanto.    The MRI of the brain is normal for age.  The MRI of the cervical spine shows advanced multilevel degenerative changes with spinal stenosis at C3-C4 through C5-C6.  Additionally, he has facet hypertrophy.  I believe his symptoms are likely due to these findings.  He feels he has done better since the gabapentin dose was increased and he will continue the higher dose of 300 mg  p.o. 4  times daily.   We discussed symptoms of TIA/CVA.     We discussed that if symptoms continue to progress we could refer him to see neurosurgery.  Otherwise, he will continue the gabapentin.  I did not schedule a follow-up visit but would be happy to see him back in the future if he has new or worsening symptoms.  Thank you for asking me to see Mr. Walraven.  Please let me know if I can be of further assistance with him or other patients in the future.  Laiklyn Pilkenton A. Felecia Shelling, MD, [facility] [id number], [id number] PM Certified in Neurology, Clinical Neurophysiology, Sleep Medicine and Neuroimaging  [facility] [address], [place], [place] [address] [phone]

## 2019-09-20 DIAGNOSIS — H353132 Nonexudative age-related macular degeneration, bilateral, intermediate dry stage: Secondary | ICD-10-CM | POA: Diagnosis not present

## 2019-09-20 DIAGNOSIS — Z9841 Cataract extraction status, right eye: Secondary | ICD-10-CM | POA: Diagnosis not present

## 2019-09-20 DIAGNOSIS — H52223 Regular astigmatism, bilateral: Secondary | ICD-10-CM | POA: Diagnosis not present

## 2019-10-19 DIAGNOSIS — R739 Hyperglycemia, unspecified: Secondary | ICD-10-CM | POA: Diagnosis not present

## 2019-10-19 DIAGNOSIS — E782 Mixed hyperlipidemia: Secondary | ICD-10-CM | POA: Diagnosis not present

## 2019-10-19 DIAGNOSIS — Z0000 Encounter for general adult medical examination without abnormal findings: Secondary | ICD-10-CM | POA: Diagnosis not present

## 2019-10-19 DIAGNOSIS — Z125 Encounter for screening for malignant neoplasm of prostate: Secondary | ICD-10-CM | POA: Diagnosis not present

## 2019-10-19 DIAGNOSIS — E538 Deficiency of other specified B group vitamins: Secondary | ICD-10-CM | POA: Diagnosis not present

## 2019-10-26 DIAGNOSIS — R82998 Other abnormal findings in urine: Secondary | ICD-10-CM | POA: Diagnosis not present

## 2019-10-26 DIAGNOSIS — Z1331 Encounter for screening for depression: Secondary | ICD-10-CM | POA: Diagnosis not present

## 2019-10-26 DIAGNOSIS — M5030 Other cervical disc degeneration, unspecified cervical region: Secondary | ICD-10-CM | POA: Diagnosis not present

## 2019-10-26 DIAGNOSIS — H47019 Ischemic optic neuropathy, unspecified eye: Secondary | ICD-10-CM | POA: Diagnosis not present

## 2019-10-26 DIAGNOSIS — I6990 Unspecified sequelae of unspecified cerebrovascular disease: Secondary | ICD-10-CM | POA: Diagnosis not present

## 2019-10-26 DIAGNOSIS — R809 Proteinuria, unspecified: Secondary | ICD-10-CM | POA: Diagnosis not present

## 2019-10-26 DIAGNOSIS — Z0000 Encounter for general adult medical examination without abnormal findings: Secondary | ICD-10-CM | POA: Diagnosis not present

## 2019-10-26 DIAGNOSIS — N1831 Chronic kidney disease, stage 3a: Secondary | ICD-10-CM | POA: Diagnosis not present

## 2019-10-26 DIAGNOSIS — R739 Hyperglycemia, unspecified: Secondary | ICD-10-CM | POA: Diagnosis not present

## 2019-10-26 DIAGNOSIS — R202 Paresthesia of skin: Secondary | ICD-10-CM | POA: Diagnosis not present

## 2019-10-26 DIAGNOSIS — I129 Hypertensive chronic kidney disease with stage 1 through stage 4 chronic kidney disease, or unspecified chronic kidney disease: Secondary | ICD-10-CM | POA: Diagnosis not present

## 2019-10-30 DIAGNOSIS — Z1212 Encounter for screening for malignant neoplasm of rectum: Secondary | ICD-10-CM | POA: Diagnosis not present

## 2020-01-11 DIAGNOSIS — L570 Actinic keratosis: Secondary | ICD-10-CM | POA: Diagnosis not present

## 2020-01-11 DIAGNOSIS — L738 Other specified follicular disorders: Secondary | ICD-10-CM | POA: Diagnosis not present

## 2020-01-11 DIAGNOSIS — D1801 Hemangioma of skin and subcutaneous tissue: Secondary | ICD-10-CM | POA: Diagnosis not present

## 2020-01-11 DIAGNOSIS — L814 Other melanin hyperpigmentation: Secondary | ICD-10-CM | POA: Diagnosis not present

## 2020-01-11 DIAGNOSIS — Z85828 Personal history of other malignant neoplasm of skin: Secondary | ICD-10-CM | POA: Diagnosis not present

## 2020-01-11 DIAGNOSIS — D485 Neoplasm of uncertain behavior of skin: Secondary | ICD-10-CM | POA: Diagnosis not present

## 2020-01-11 DIAGNOSIS — L821 Other seborrheic keratosis: Secondary | ICD-10-CM | POA: Diagnosis not present

## 2020-01-11 DIAGNOSIS — D225 Melanocytic nevi of trunk: Secondary | ICD-10-CM | POA: Diagnosis not present

## 2020-01-11 DIAGNOSIS — D2272 Melanocytic nevi of left lower limb, including hip: Secondary | ICD-10-CM | POA: Diagnosis not present

## 2020-03-16 DIAGNOSIS — Z23 Encounter for immunization: Secondary | ICD-10-CM | POA: Diagnosis not present

## 2020-04-17 DIAGNOSIS — H35372 Puckering of macula, left eye: Secondary | ICD-10-CM | POA: Diagnosis not present

## 2020-04-17 DIAGNOSIS — H353122 Nonexudative age-related macular degeneration, left eye, intermediate dry stage: Secondary | ICD-10-CM | POA: Diagnosis not present

## 2020-04-17 DIAGNOSIS — H43813 Vitreous degeneration, bilateral: Secondary | ICD-10-CM | POA: Diagnosis not present

## 2020-04-17 DIAGNOSIS — H47011 Ischemic optic neuropathy, right eye: Secondary | ICD-10-CM | POA: Diagnosis not present

## 2020-04-23 DIAGNOSIS — R809 Proteinuria, unspecified: Secondary | ICD-10-CM | POA: Diagnosis not present

## 2020-04-23 DIAGNOSIS — E782 Mixed hyperlipidemia: Secondary | ICD-10-CM | POA: Diagnosis not present

## 2020-04-23 DIAGNOSIS — D751 Secondary polycythemia: Secondary | ICD-10-CM | POA: Diagnosis not present

## 2020-04-23 DIAGNOSIS — R739 Hyperglycemia, unspecified: Secondary | ICD-10-CM | POA: Diagnosis not present

## 2020-04-23 DIAGNOSIS — N1831 Chronic kidney disease, stage 3a: Secondary | ICD-10-CM | POA: Diagnosis not present

## 2020-04-23 DIAGNOSIS — I6990 Unspecified sequelae of unspecified cerebrovascular disease: Secondary | ICD-10-CM | POA: Diagnosis not present

## 2020-04-23 DIAGNOSIS — E538 Deficiency of other specified B group vitamins: Secondary | ICD-10-CM | POA: Diagnosis not present

## 2020-04-23 DIAGNOSIS — G629 Polyneuropathy, unspecified: Secondary | ICD-10-CM | POA: Diagnosis not present

## 2020-04-23 DIAGNOSIS — I129 Hypertensive chronic kidney disease with stage 1 through stage 4 chronic kidney disease, or unspecified chronic kidney disease: Secondary | ICD-10-CM | POA: Diagnosis not present

## 2020-05-23 DIAGNOSIS — R300 Dysuria: Secondary | ICD-10-CM | POA: Diagnosis not present

## 2020-05-27 ENCOUNTER — Other Ambulatory Visit: Payer: Self-pay | Admitting: Internal Medicine

## 2020-05-27 DIAGNOSIS — R319 Hematuria, unspecified: Secondary | ICD-10-CM

## 2020-05-29 ENCOUNTER — Ambulatory Visit
Admission: RE | Admit: 2020-05-29 | Discharge: 2020-05-29 | Disposition: A | Discharge status: Home / Self Care | Payer: Medicare PPO | Source: Ambulatory Visit | Attending: Internal Medicine | Admitting: Internal Medicine

## 2020-05-29 DIAGNOSIS — N1330 Unspecified hydronephrosis: Secondary | ICD-10-CM | POA: Diagnosis not present

## 2020-05-29 DIAGNOSIS — R319 Hematuria, unspecified: Secondary | ICD-10-CM

## 2020-05-29 DIAGNOSIS — R109 Unspecified abdominal pain: Secondary | ICD-10-CM | POA: Diagnosis not present

## 2020-05-29 DIAGNOSIS — K5730 Diverticulosis of large intestine without perforation or abscess without bleeding: Secondary | ICD-10-CM | POA: Diagnosis not present

## 2020-05-29 DIAGNOSIS — R188 Other ascites: Secondary | ICD-10-CM | POA: Diagnosis not present

## 2020-09-12 DIAGNOSIS — R300 Dysuria: Secondary | ICD-10-CM | POA: Diagnosis not present

## 2020-09-12 DIAGNOSIS — N411 Chronic prostatitis: Secondary | ICD-10-CM | POA: Diagnosis not present

## 2020-09-16 DIAGNOSIS — L570 Actinic keratosis: Secondary | ICD-10-CM | POA: Diagnosis not present

## 2020-09-16 DIAGNOSIS — Z85828 Personal history of other malignant neoplasm of skin: Secondary | ICD-10-CM | POA: Diagnosis not present

## 2020-10-29 ENCOUNTER — Other Ambulatory Visit: Payer: Self-pay

## 2020-10-29 ENCOUNTER — Other Ambulatory Visit (HOSPITAL_COMMUNITY): Payer: Self-pay | Admitting: Internal Medicine

## 2020-10-29 ENCOUNTER — Ambulatory Visit (HOSPITAL_COMMUNITY)
Admission: RE | Admit: 2020-10-29 | Discharge: 2020-10-29 | Disposition: A | Discharge status: Home / Self Care | Payer: Medicare PPO | Source: Ambulatory Visit | Attending: Cardiovascular Disease | Admitting: Cardiovascular Disease

## 2020-10-29 DIAGNOSIS — R600 Localized edema: Secondary | ICD-10-CM | POA: Diagnosis not present

## 2020-10-29 DIAGNOSIS — M7989 Other specified soft tissue disorders: Secondary | ICD-10-CM | POA: Insufficient documentation

## 2020-10-29 DIAGNOSIS — N1831 Chronic kidney disease, stage 3a: Secondary | ICD-10-CM | POA: Diagnosis not present

## 2020-10-29 DIAGNOSIS — I129 Hypertensive chronic kidney disease with stage 1 through stage 4 chronic kidney disease, or unspecified chronic kidney disease: Secondary | ICD-10-CM | POA: Diagnosis not present

## 2020-10-29 DIAGNOSIS — E782 Mixed hyperlipidemia: Secondary | ICD-10-CM | POA: Diagnosis not present

## 2020-11-08 DIAGNOSIS — Z0000 Encounter for general adult medical examination without abnormal findings: Secondary | ICD-10-CM | POA: Diagnosis not present

## 2020-11-08 DIAGNOSIS — Z125 Encounter for screening for malignant neoplasm of prostate: Secondary | ICD-10-CM | POA: Diagnosis not present

## 2020-11-08 DIAGNOSIS — E782 Mixed hyperlipidemia: Secondary | ICD-10-CM | POA: Diagnosis not present

## 2020-11-15 DIAGNOSIS — Z1331 Encounter for screening for depression: Secondary | ICD-10-CM | POA: Diagnosis not present

## 2020-11-15 DIAGNOSIS — D751 Secondary polycythemia: Secondary | ICD-10-CM | POA: Diagnosis not present

## 2020-11-15 DIAGNOSIS — E782 Mixed hyperlipidemia: Secondary | ICD-10-CM | POA: Diagnosis not present

## 2020-11-15 DIAGNOSIS — N1831 Chronic kidney disease, stage 3a: Secondary | ICD-10-CM | POA: Diagnosis not present

## 2020-11-15 DIAGNOSIS — I6990 Unspecified sequelae of unspecified cerebrovascular disease: Secondary | ICD-10-CM | POA: Diagnosis not present

## 2020-11-15 DIAGNOSIS — Z0000 Encounter for general adult medical examination without abnormal findings: Secondary | ICD-10-CM | POA: Diagnosis not present

## 2020-11-15 DIAGNOSIS — Z1339 Encounter for screening examination for other mental health and behavioral disorders: Secondary | ICD-10-CM | POA: Diagnosis not present

## 2020-11-15 DIAGNOSIS — I129 Hypertensive chronic kidney disease with stage 1 through stage 4 chronic kidney disease, or unspecified chronic kidney disease: Secondary | ICD-10-CM | POA: Diagnosis not present

## 2020-11-15 DIAGNOSIS — R809 Proteinuria, unspecified: Secondary | ICD-10-CM | POA: Diagnosis not present

## 2020-11-15 DIAGNOSIS — R82998 Other abnormal findings in urine: Secondary | ICD-10-CM | POA: Diagnosis not present

## 2020-11-18 DIAGNOSIS — Z1212 Encounter for screening for malignant neoplasm of rectum: Secondary | ICD-10-CM | POA: Diagnosis not present

## 2020-12-17 DIAGNOSIS — Z9841 Cataract extraction status, right eye: Secondary | ICD-10-CM | POA: Diagnosis not present

## 2020-12-17 DIAGNOSIS — Z9842 Cataract extraction status, left eye: Secondary | ICD-10-CM | POA: Diagnosis not present

## 2020-12-17 DIAGNOSIS — H353132 Nonexudative age-related macular degeneration, bilateral, intermediate dry stage: Secondary | ICD-10-CM | POA: Diagnosis not present

## 2020-12-17 DIAGNOSIS — H52223 Regular astigmatism, bilateral: Secondary | ICD-10-CM | POA: Diagnosis not present

## 2021-01-13 DIAGNOSIS — D225 Melanocytic nevi of trunk: Secondary | ICD-10-CM | POA: Diagnosis not present

## 2021-01-13 DIAGNOSIS — L821 Other seborrheic keratosis: Secondary | ICD-10-CM | POA: Diagnosis not present

## 2021-01-13 DIAGNOSIS — D2262 Melanocytic nevi of left upper limb, including shoulder: Secondary | ICD-10-CM | POA: Diagnosis not present

## 2021-01-13 DIAGNOSIS — Z85828 Personal history of other malignant neoplasm of skin: Secondary | ICD-10-CM | POA: Diagnosis not present

## 2021-01-13 DIAGNOSIS — L738 Other specified follicular disorders: Secondary | ICD-10-CM | POA: Diagnosis not present

## 2021-01-13 DIAGNOSIS — L814 Other melanin hyperpigmentation: Secondary | ICD-10-CM | POA: Diagnosis not present

## 2021-01-13 DIAGNOSIS — D1801 Hemangioma of skin and subcutaneous tissue: Secondary | ICD-10-CM | POA: Diagnosis not present

## 2021-01-13 DIAGNOSIS — D2271 Melanocytic nevi of right lower limb, including hip: Secondary | ICD-10-CM | POA: Diagnosis not present

## 2021-01-27 DIAGNOSIS — R809 Proteinuria, unspecified: Secondary | ICD-10-CM | POA: Diagnosis not present

## 2021-01-27 DIAGNOSIS — R300 Dysuria: Secondary | ICD-10-CM | POA: Diagnosis not present

## 2021-01-27 DIAGNOSIS — N390 Urinary tract infection, site not specified: Secondary | ICD-10-CM | POA: Diagnosis not present

## 2021-01-27 DIAGNOSIS — N1831 Chronic kidney disease, stage 3a: Secondary | ICD-10-CM | POA: Diagnosis not present

## 2021-01-27 DIAGNOSIS — F4321 Adjustment disorder with depressed mood: Secondary | ICD-10-CM | POA: Diagnosis not present

## 2021-01-27 DIAGNOSIS — R319 Hematuria, unspecified: Secondary | ICD-10-CM | POA: Diagnosis not present

## 2021-01-27 DIAGNOSIS — I129 Hypertensive chronic kidney disease with stage 1 through stage 4 chronic kidney disease, or unspecified chronic kidney disease: Secondary | ICD-10-CM | POA: Diagnosis not present

## 2021-02-10 DIAGNOSIS — R82998 Other abnormal findings in urine: Secondary | ICD-10-CM | POA: Diagnosis not present

## 2021-02-20 DIAGNOSIS — L820 Inflamed seborrheic keratosis: Secondary | ICD-10-CM | POA: Diagnosis not present

## 2021-02-20 DIAGNOSIS — Z85828 Personal history of other malignant neoplasm of skin: Secondary | ICD-10-CM | POA: Diagnosis not present

## 2021-02-20 DIAGNOSIS — L738 Other specified follicular disorders: Secondary | ICD-10-CM | POA: Diagnosis not present

## 2021-03-15 DIAGNOSIS — Z23 Encounter for immunization: Secondary | ICD-10-CM | POA: Diagnosis not present

## 2021-05-19 DIAGNOSIS — N1831 Chronic kidney disease, stage 3a: Secondary | ICD-10-CM | POA: Diagnosis not present

## 2021-05-19 DIAGNOSIS — R809 Proteinuria, unspecified: Secondary | ICD-10-CM | POA: Diagnosis not present

## 2021-05-19 DIAGNOSIS — R739 Hyperglycemia, unspecified: Secondary | ICD-10-CM | POA: Diagnosis not present

## 2021-05-19 DIAGNOSIS — D751 Secondary polycythemia: Secondary | ICD-10-CM | POA: Diagnosis not present

## 2021-05-19 DIAGNOSIS — I129 Hypertensive chronic kidney disease with stage 1 through stage 4 chronic kidney disease, or unspecified chronic kidney disease: Secondary | ICD-10-CM | POA: Diagnosis not present

## 2021-05-19 DIAGNOSIS — I6990 Unspecified sequelae of unspecified cerebrovascular disease: Secondary | ICD-10-CM | POA: Diagnosis not present

## 2021-05-19 DIAGNOSIS — E782 Mixed hyperlipidemia: Secondary | ICD-10-CM | POA: Diagnosis not present

## 2021-05-19 DIAGNOSIS — E538 Deficiency of other specified B group vitamins: Secondary | ICD-10-CM | POA: Diagnosis not present

## 2021-05-19 DIAGNOSIS — G629 Polyneuropathy, unspecified: Secondary | ICD-10-CM | POA: Diagnosis not present

## 2021-05-28 DIAGNOSIS — H43813 Vitreous degeneration, bilateral: Secondary | ICD-10-CM | POA: Diagnosis not present

## 2021-05-28 DIAGNOSIS — H35372 Puckering of macula, left eye: Secondary | ICD-10-CM | POA: Diagnosis not present

## 2021-05-28 DIAGNOSIS — H35363 Drusen (degenerative) of macula, bilateral: Secondary | ICD-10-CM | POA: Diagnosis not present

## 2021-05-28 DIAGNOSIS — H47011 Ischemic optic neuropathy, right eye: Secondary | ICD-10-CM | POA: Diagnosis not present

## 2021-07-14 DIAGNOSIS — R300 Dysuria: Secondary | ICD-10-CM | POA: Diagnosis not present

## 2021-07-14 DIAGNOSIS — R319 Hematuria, unspecified: Secondary | ICD-10-CM | POA: Diagnosis not present

## 2021-07-17 DIAGNOSIS — R300 Dysuria: Secondary | ICD-10-CM | POA: Diagnosis not present

## 2021-07-17 DIAGNOSIS — R310 Gross hematuria: Secondary | ICD-10-CM | POA: Diagnosis not present

## 2021-07-17 DIAGNOSIS — N3000 Acute cystitis without hematuria: Secondary | ICD-10-CM | POA: Diagnosis not present

## 2021-07-20 IMAGING — MR MR HEAD W/O CM
12 series · 45 of 48 positions shown · non-contrast
Comparison: 6749

CLINICAL DATA: Paresthesias

EXAM:
MRI HEAD WITHOUT CONTRAST
TECHNIQUE: Multiplanar, multiecho pulse sequences of the brain and surrounding
structures were obtained without intravenous contrast.

[Series 5: ax dwi_tracew · axial · 3.0mm · 0.60mm/px · z∈[-83,+67]mm · 4 of 48 slices shown]
[im 1/48]
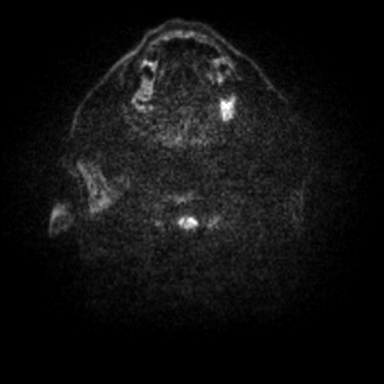
[im 16/48]
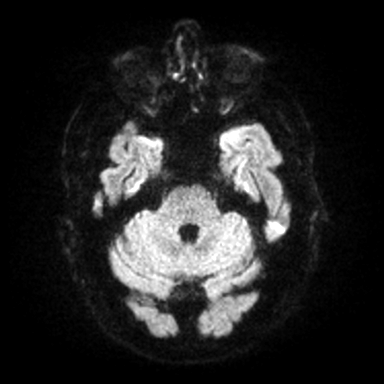
[im 32/48]
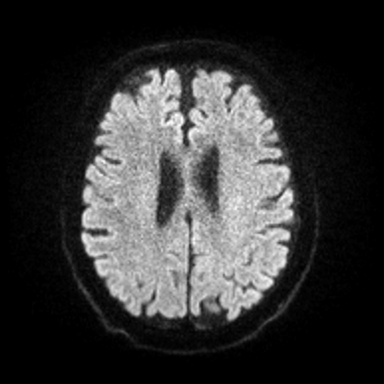
[im 48/48]
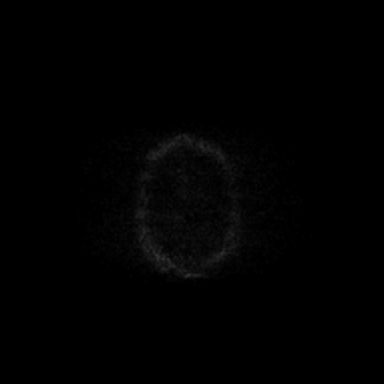

[Series 6: ax dwi_adc · axial · 3.0mm · 0.60mm/px · z∈[-83,+67]mm · 3 of 48 slices shown]
[im 1/48]
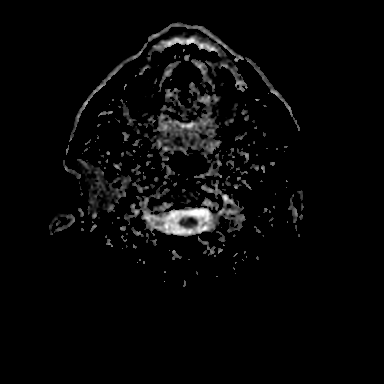
[im 24/48]
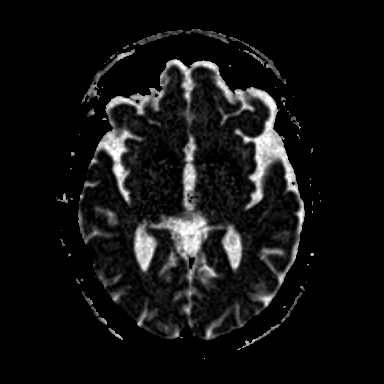
[im 48/48]
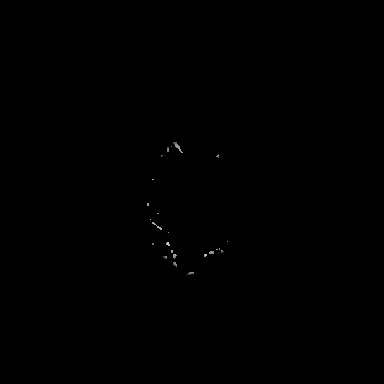

[Series 7: cor dwi_tracew · coronal · 5.0mm · 0.60mm/px · 3 of 38 slices shown]
[im 1/38]
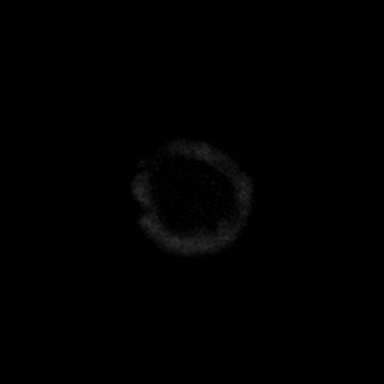
[im 19/38]
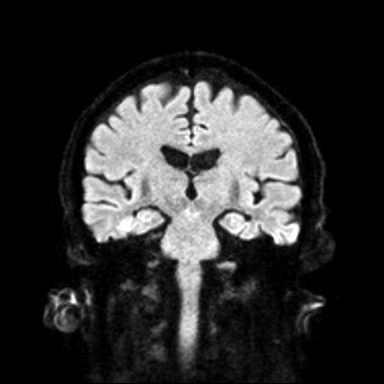
[im 38/38]
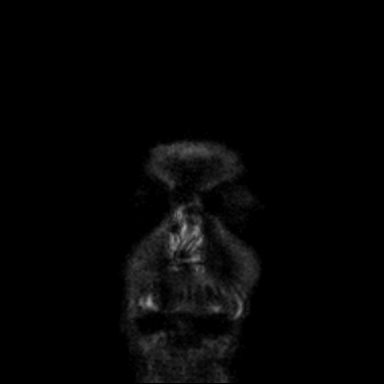

[Series 8: cor dwi_adc · coronal · 5.0mm · 0.60mm/px · 3 of 38 slices shown]
[im 1/38]
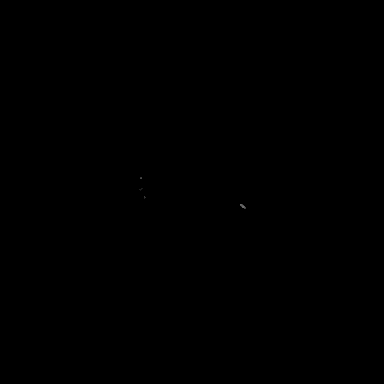
[im 19/38]
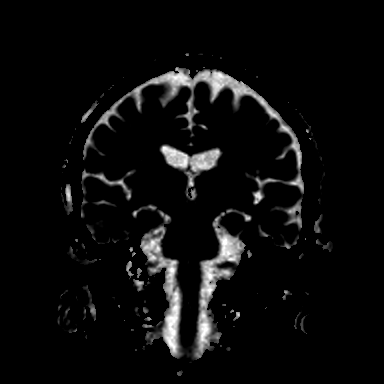
[im 38/38]
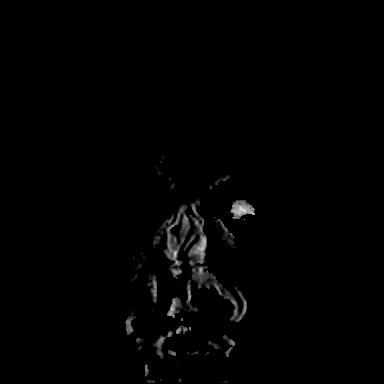

[Series 9: T1 · sagittal · 5.0mm · 0.62mm/px · 2 of 25 slices shown (1 of 2)]
[im 1/25]
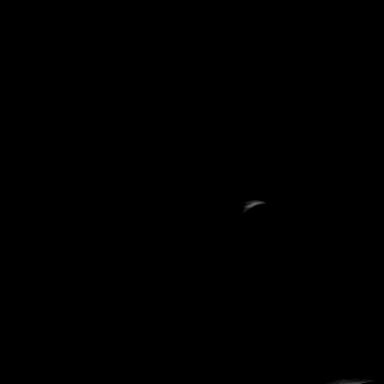
[im 25/25]
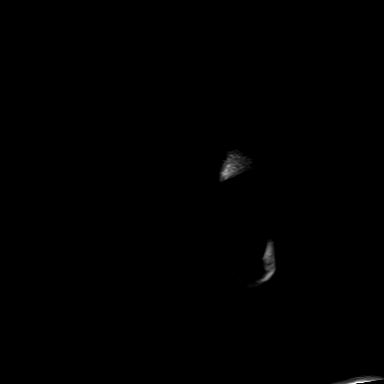

[Series 10: T2 · axial · 5.0mm · 0.53mm/px · z∈[-84,+67]mm · 2 of 27 slices shown (1 of 2)]
[im 1/27]
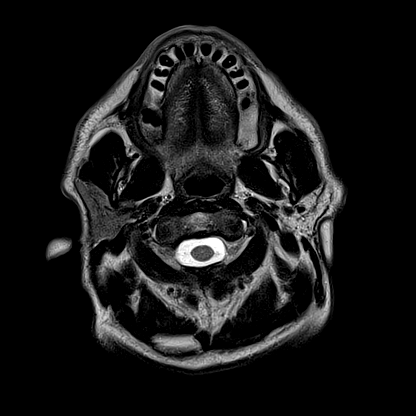
[im 27/27]
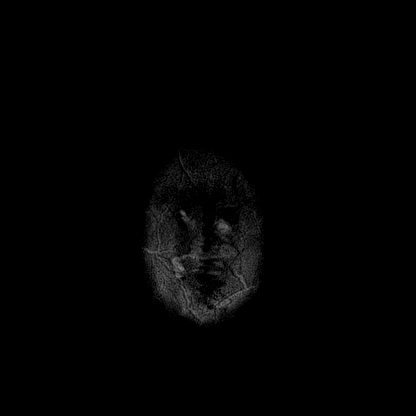

[Series 11: mag_images · axial · 3.0mm · 0.90mm/px · z∈[-93,+78]mm · 4 of 60 slices shown]
[im 1/60]
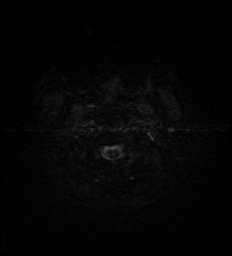
[im 20/60]
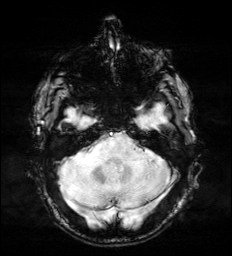
[im 40/60]
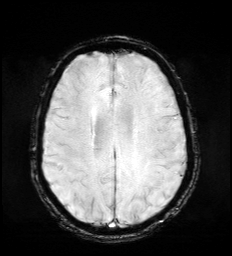
[im 60/60]
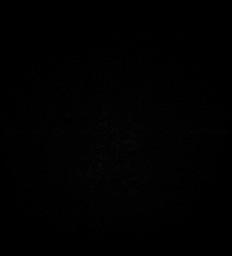

[Series 12: pha_images · axial · 3.0mm · 0.90mm/px · z∈[-93,+75]mm · 4 of 58 slices shown]
[im 1/58]
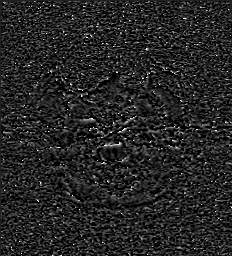
[im 20/58]
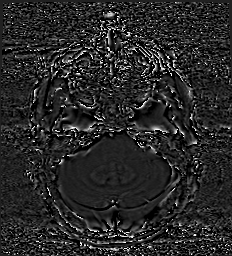
[im 39/58]
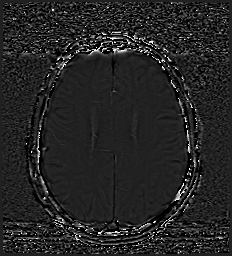
[im 58/58]
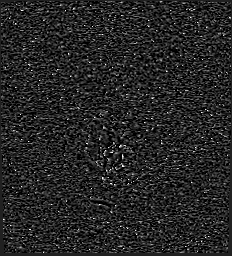

[Series 13: swi_images · axial · 3.0mm · 0.90mm/px · z∈[-93,+78]mm · 4 of 60 slices shown]
[im 1/60]
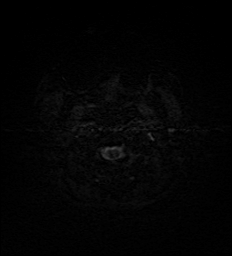
[im 20/60]
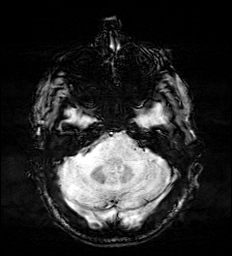
[im 40/60]
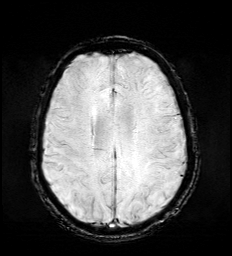
[im 60/60]
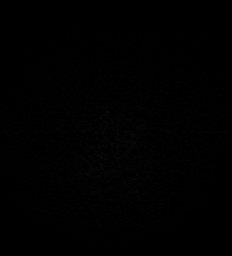

[Series 15: FLAIR · axial · 3.0mm · 0.53mm/px · z∈[-87,+70]mm · 4 of 55 slices shown]
[im 1/55]
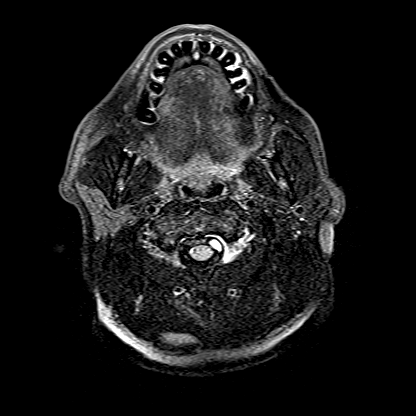
[im 19/55]
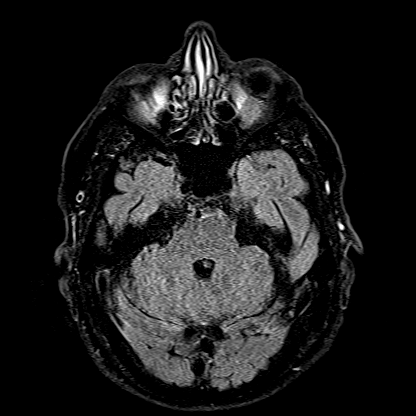
[im 37/55]
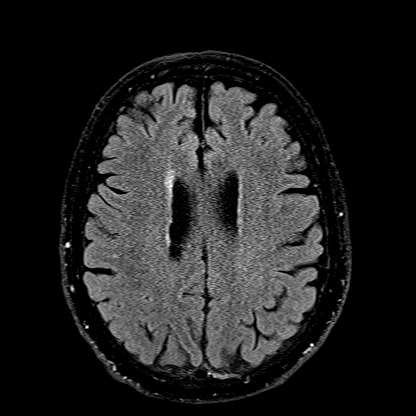
[im 55/55]
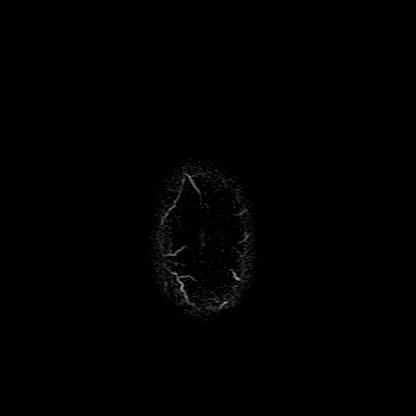

[Series 16: T1 · axial · 1.0mm · 0.98mm/px · z∈[-91,+79]mm · 10 of 176 slices shown (2 of 2)]
[im 1/176]
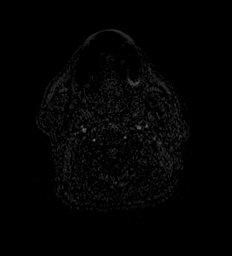
[im 15/176]
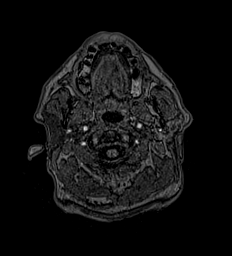
[im 30/176]
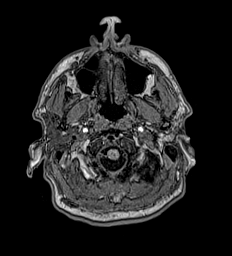
[im 44/176]
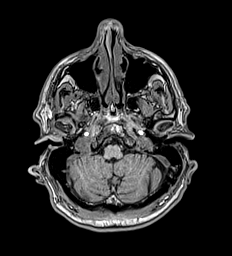
[im 59/176]
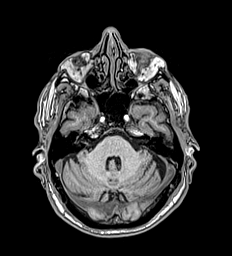
[im 73/176]
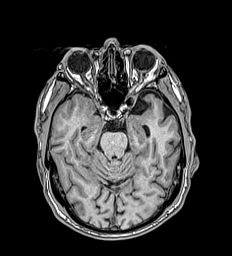
[im 103/176]
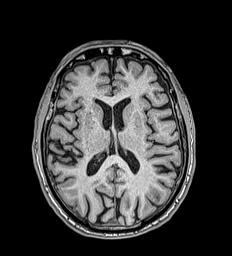
[im 117/176]
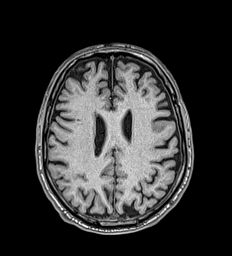
[im 146/176]
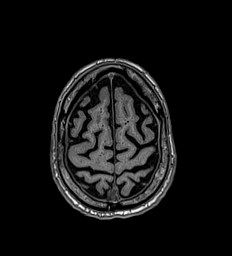
[im 176/176]
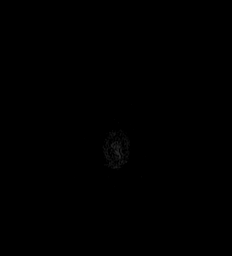

[Series 17: T2 · coronal · 5.0mm · 0.57mm/px · 2 of 29 slices shown (2 of 2)]
[im 1/29]
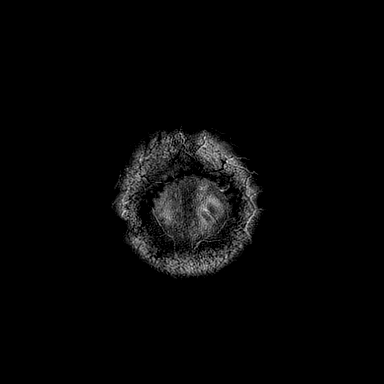
[im 29/29]
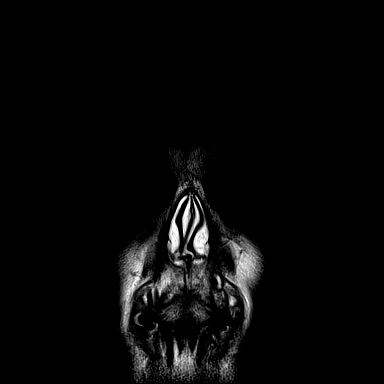

[45 of 48 positions shown; findings below may reference images not displayed]

FINDINGS: Brain: There is no acute infarction or intracranial hemorrhage.
There is no intracranial mass, mass effect, or edema. There is no
hydrocephalus or extra-axial fluid collection. Minimal foci of T2
hyperintensity in the supratentorial white matter are nonspecific
may reflect stable minor chronic microvascular ischemic changes.

Vascular: Major vessel flow voids at the skull base are preserved.

Skull and upper cervical spine: Normal marrow signal is preserved.

Sinuses/Orbits: Trace mucosal thickening. Bilateral lens
replacements.

Other: Sella is unremarkable.  Mastoid air cells are clear.
IMPRESSION: No evidence of recent infarction, hemorrhage, or mass.

## 2021-07-20 IMAGING — MR MR CERVICAL SPINE W/O CM
5 series · 38 of 48 positions shown · non-contrast
Comparison: 4886

CLINICAL DATA: Bilateral posterior shoulder burning

EXAM:
MRI CERVICAL SPINE WITHOUT CONTRAST
TECHNIQUE: Multiplanar, multisequence MR imaging of the cervical spine was
performed. No intravenous contrast was administered.

[Series 5: T2 · sagittal · 3.0mm · 0.62mm/px · 7 of 15 slices shown (1 of 2)]
[im 1/15]
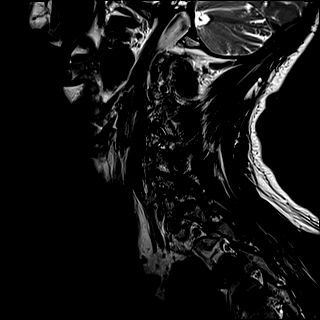
[im 3/15]
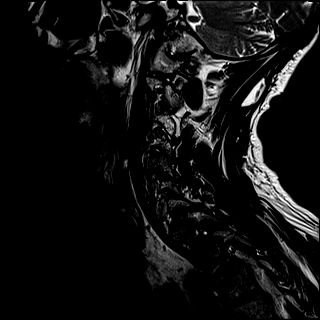
[im 5/15]
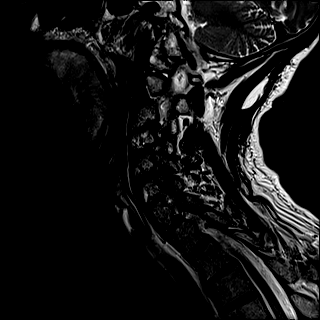
[im 8/15]
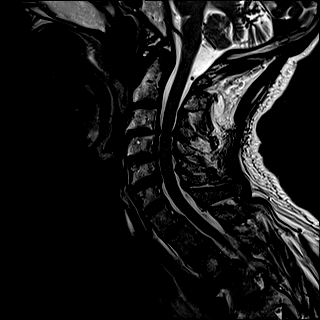
[im 10/15]
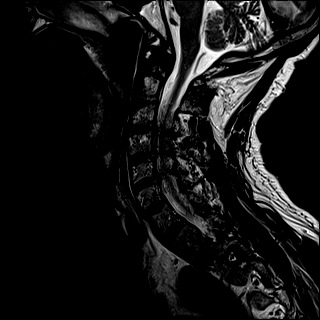
[im 12/15]
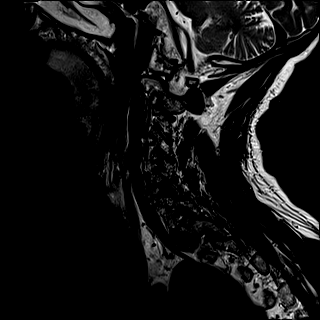
[im 15/15]
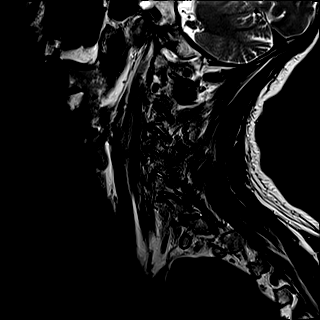

[Series 6: FLAIR · sagittal · 3.0mm · 0.78mm/px · 7 of 15 slices shown]
[im 1/15]
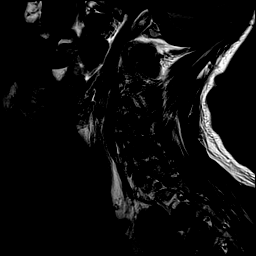
[im 3/15]
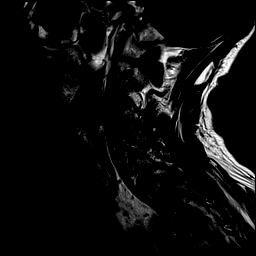
[im 5/15]
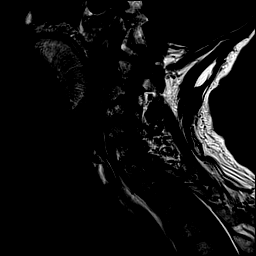
[im 8/15]
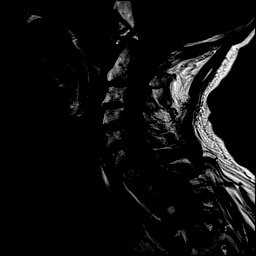
[im 10/15]
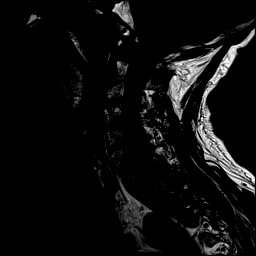
[im 12/15]
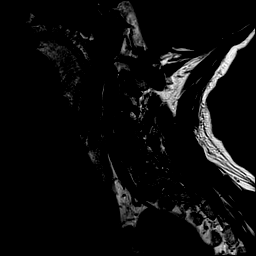
[im 15/15]
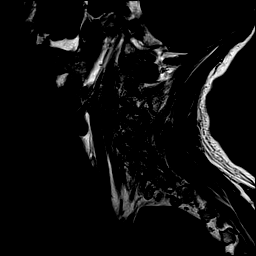

[Series 7: STIR · sagittal · 3.0mm · 0.62mm/px · 7 of 15 slices shown]
[im 1/15]
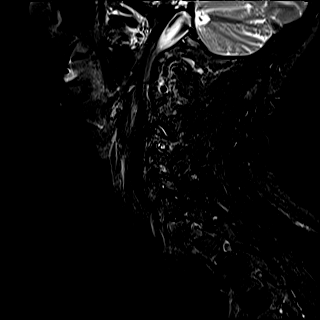
[im 3/15]
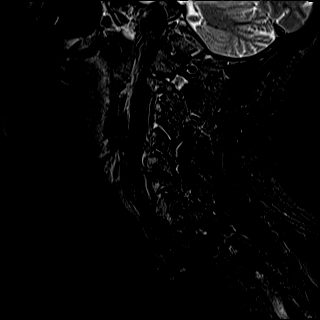
[im 5/15]
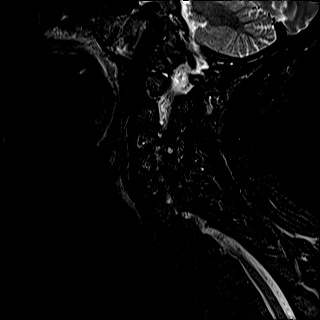
[im 8/15]
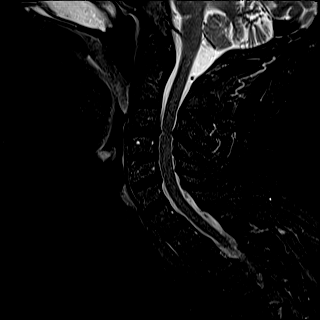
[im 10/15]
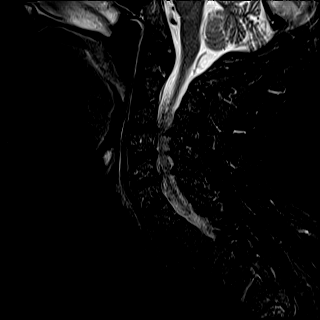
[im 12/15]
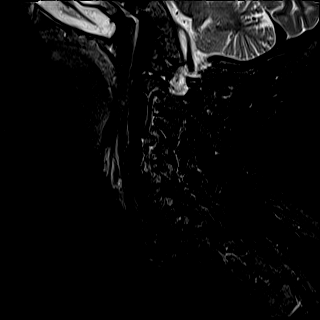
[im 15/15]
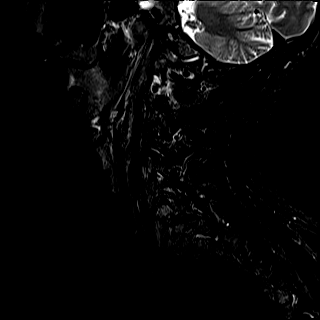

[Series 8: T2 · axial · 3.0mm · 0.70mm/px · z∈[-240,-143]mm · 9 of 28 slices shown (2 of 2)]
[im 1/28]
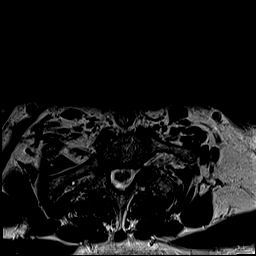
[im 5/28]
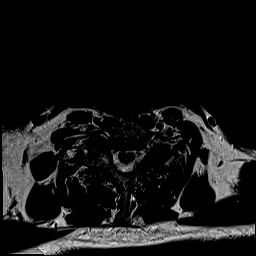
[im 10/28]
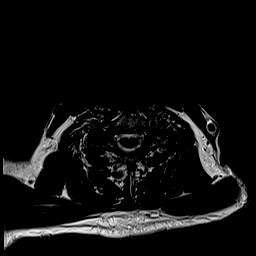
[im 12/28]
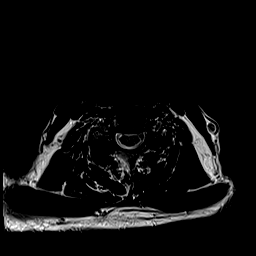
[im 14/28]
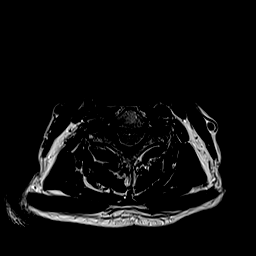
[im 16/28]
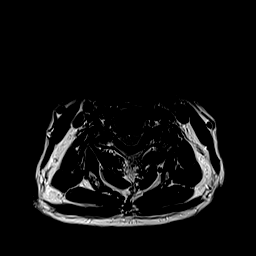
[im 19/28]
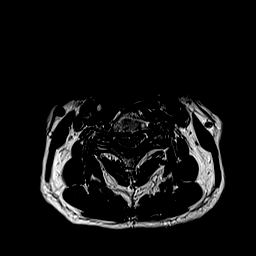
[im 23/28]
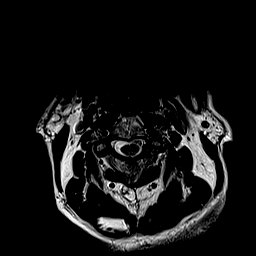
[im 28/28]
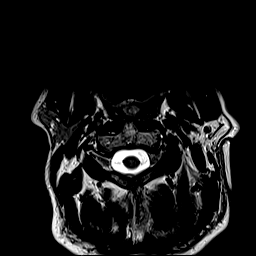

[Series 9: ax mpgr · axial · 3.0mm · 0.35mm/px · z∈[-240,-143]mm · 8 of 29 slices shown]
[im 1/29]
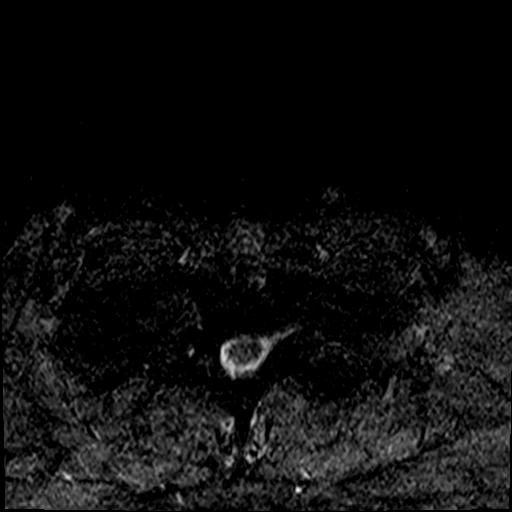
[im 5/29]
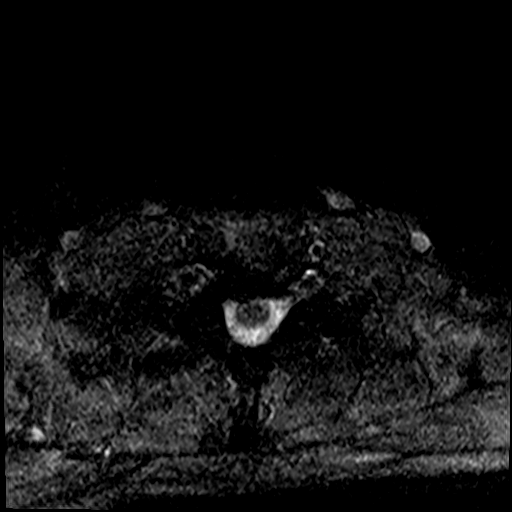
[im 9/29]
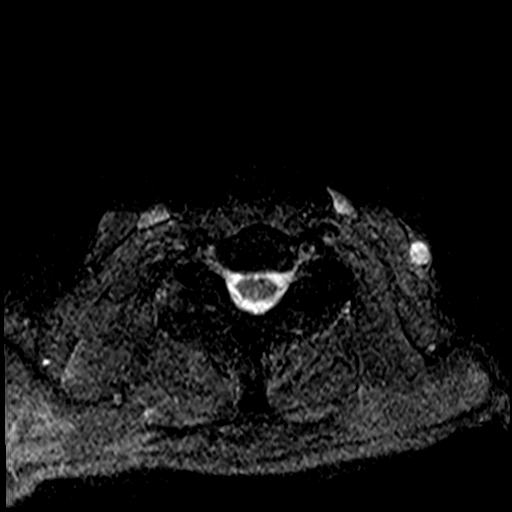
[im 13/29]
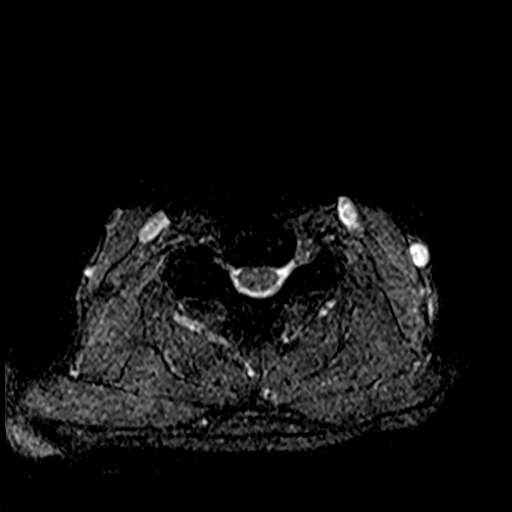
[im 16/29]
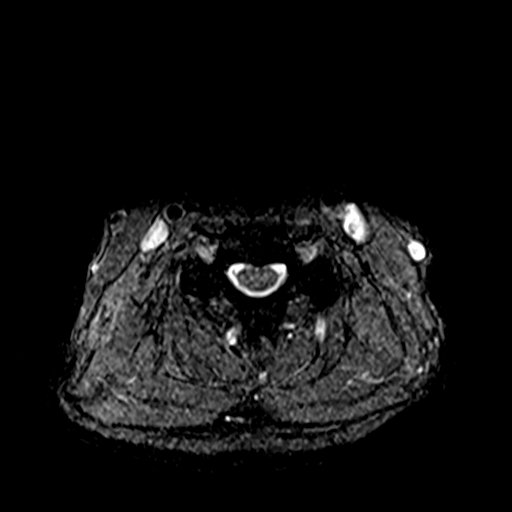
[im 20/29]
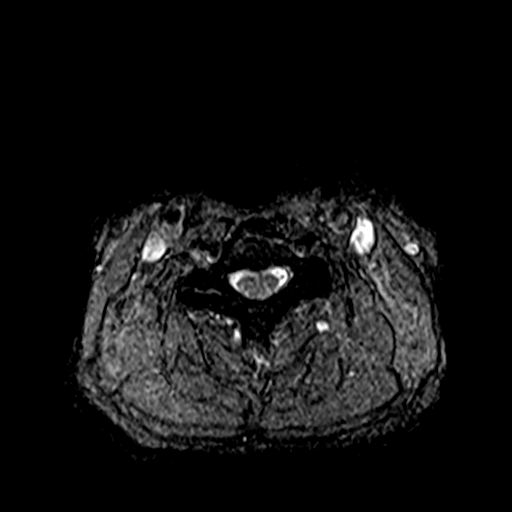
[im 24/29]
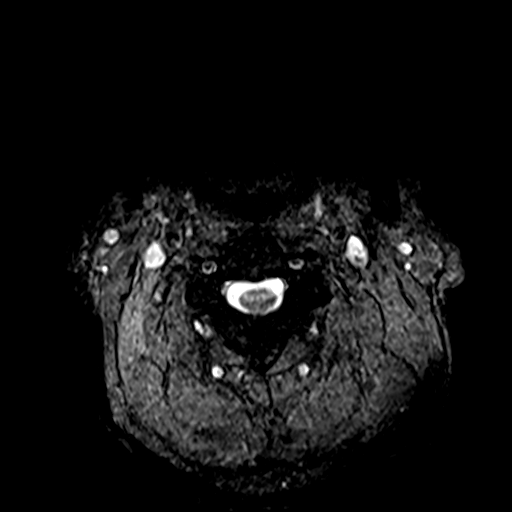
[im 29/29]
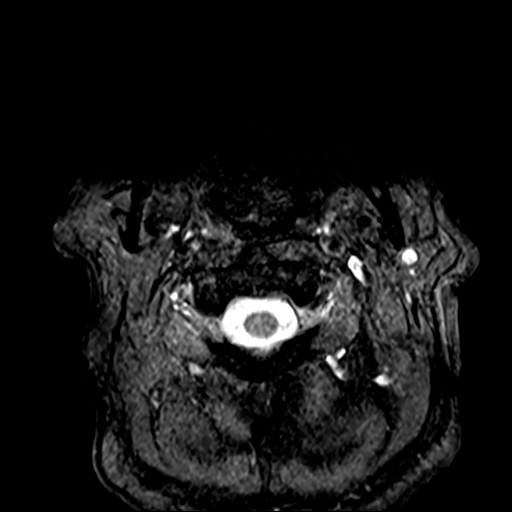

[38 of 48 positions shown; findings below may reference images not displayed]

FINDINGS: Alignment: Stable.

Vertebrae: Stable vertebral body heights. A small hemangiomas at fat
but within the T2 vertebral body. There is no substantial marrow
edema or suspicious osseous lesion.

Cord: No abnormal signal.

Posterior Fossa, vertebral arteries, paraspinal tissues:
Unremarkable.

Disc levels:

C2-C3: Left facet hypertrophy. No significant canal or foraminal
stenosis.

C3-C4: Disc bulge eccentric to the right with endplate osteophytes
and facet uncovertebral hypertrophy. Moderate canal stenosis.
Moderate foraminal stenosis. Appearance is similar.

C4-C5: Disc bulge with endplate osteophytes and facet greater than
uncovertebral hypertrophy. Mild to moderate canal stenosis. No right
foraminal stenosis. Moderate left foraminal stenosis. Appearance is
similar.

C5-C6: Disc bulge with endplate osteophytes and facet (particularly
on the right) greater than uncovertebral hypertrophy. Mild canal
stenosis. Mild to moderate right and mild left foraminal stenosis.
Appearance is similar.

C6-C7: Minimal disc bulge. Left greater than right facet hypertrophy
and mild uncovertebral hypertrophy. No canal stenosis. No right
foraminal stenosis. Mild left foraminal stenosis.

C7-T1: Anterolisthesis with uncovering of disc bulge eccentric to
the right. Facet hypertrophy. No canal stenosis. Mild right
foraminal stenosis. No left foraminal stenosis. Appearance is
similar.
IMPRESSION: Multilevel degenerative changes as detailed above with similar
appearance to 4886 examination.

## 2021-07-30 DIAGNOSIS — R310 Gross hematuria: Secondary | ICD-10-CM | POA: Diagnosis not present

## 2021-07-30 DIAGNOSIS — K7689 Other specified diseases of liver: Secondary | ICD-10-CM | POA: Diagnosis not present

## 2021-07-30 DIAGNOSIS — N200 Calculus of kidney: Secondary | ICD-10-CM | POA: Diagnosis not present

## 2021-08-07 DIAGNOSIS — R310 Gross hematuria: Secondary | ICD-10-CM | POA: Diagnosis not present

## 2021-08-07 DIAGNOSIS — N202 Calculus of kidney with calculus of ureter: Secondary | ICD-10-CM | POA: Diagnosis not present

## 2021-08-27 DIAGNOSIS — N202 Calculus of kidney with calculus of ureter: Secondary | ICD-10-CM | POA: Diagnosis not present

## 2021-08-27 DIAGNOSIS — N200 Calculus of kidney: Secondary | ICD-10-CM | POA: Diagnosis not present

## 2021-08-27 DIAGNOSIS — N201 Calculus of ureter: Secondary | ICD-10-CM | POA: Diagnosis not present

## 2021-09-03 DIAGNOSIS — N138 Other obstructive and reflux uropathy: Secondary | ICD-10-CM | POA: Diagnosis not present

## 2021-09-03 DIAGNOSIS — N401 Enlarged prostate with lower urinary tract symptoms: Secondary | ICD-10-CM | POA: Diagnosis not present

## 2021-09-03 DIAGNOSIS — N200 Calculus of kidney: Secondary | ICD-10-CM | POA: Diagnosis not present

## 2021-09-10 DIAGNOSIS — N202 Calculus of kidney with calculus of ureter: Secondary | ICD-10-CM | POA: Diagnosis not present

## 2021-10-02 DIAGNOSIS — L821 Other seborrheic keratosis: Secondary | ICD-10-CM | POA: Diagnosis not present

## 2021-10-02 DIAGNOSIS — Z85828 Personal history of other malignant neoplasm of skin: Secondary | ICD-10-CM | POA: Diagnosis not present

## 2021-10-02 DIAGNOSIS — L570 Actinic keratosis: Secondary | ICD-10-CM | POA: Diagnosis not present

## 2021-10-07 DIAGNOSIS — N202 Calculus of kidney with calculus of ureter: Secondary | ICD-10-CM | POA: Diagnosis not present

## 2021-11-17 DIAGNOSIS — N202 Calculus of kidney with calculus of ureter: Secondary | ICD-10-CM | POA: Diagnosis not present

## 2021-11-20 DIAGNOSIS — E782 Mixed hyperlipidemia: Secondary | ICD-10-CM | POA: Diagnosis not present

## 2021-11-20 DIAGNOSIS — E538 Deficiency of other specified B group vitamins: Secondary | ICD-10-CM | POA: Diagnosis not present

## 2021-11-20 DIAGNOSIS — N1831 Chronic kidney disease, stage 3a: Secondary | ICD-10-CM | POA: Diagnosis not present

## 2021-11-20 DIAGNOSIS — R202 Paresthesia of skin: Secondary | ICD-10-CM | POA: Diagnosis not present

## 2021-11-20 DIAGNOSIS — R739 Hyperglycemia, unspecified: Secondary | ICD-10-CM | POA: Diagnosis not present

## 2021-11-25 DIAGNOSIS — N401 Enlarged prostate with lower urinary tract symptoms: Secondary | ICD-10-CM | POA: Diagnosis not present

## 2021-11-25 DIAGNOSIS — R3915 Urgency of urination: Secondary | ICD-10-CM | POA: Diagnosis not present

## 2021-11-25 DIAGNOSIS — N200 Calculus of kidney: Secondary | ICD-10-CM | POA: Diagnosis not present

## 2021-11-25 DIAGNOSIS — R351 Nocturia: Secondary | ICD-10-CM | POA: Diagnosis not present

## 2021-11-26 DIAGNOSIS — H47011 Ischemic optic neuropathy, right eye: Secondary | ICD-10-CM | POA: Diagnosis not present

## 2021-11-26 DIAGNOSIS — H35363 Drusen (degenerative) of macula, bilateral: Secondary | ICD-10-CM | POA: Diagnosis not present

## 2021-11-27 DIAGNOSIS — Z0000 Encounter for general adult medical examination without abnormal findings: Secondary | ICD-10-CM | POA: Diagnosis not present

## 2021-11-27 DIAGNOSIS — R809 Proteinuria, unspecified: Secondary | ICD-10-CM | POA: Diagnosis not present

## 2021-11-27 DIAGNOSIS — D751 Secondary polycythemia: Secondary | ICD-10-CM | POA: Diagnosis not present

## 2021-11-27 DIAGNOSIS — E538 Deficiency of other specified B group vitamins: Secondary | ICD-10-CM | POA: Diagnosis not present

## 2021-11-27 DIAGNOSIS — I129 Hypertensive chronic kidney disease with stage 1 through stage 4 chronic kidney disease, or unspecified chronic kidney disease: Secondary | ICD-10-CM | POA: Diagnosis not present

## 2021-11-27 DIAGNOSIS — N400 Enlarged prostate without lower urinary tract symptoms: Secondary | ICD-10-CM | POA: Diagnosis not present

## 2021-11-27 DIAGNOSIS — Z1389 Encounter for screening for other disorder: Secondary | ICD-10-CM | POA: Diagnosis not present

## 2021-11-27 DIAGNOSIS — N1831 Chronic kidney disease, stage 3a: Secondary | ICD-10-CM | POA: Diagnosis not present

## 2021-11-27 DIAGNOSIS — E782 Mixed hyperlipidemia: Secondary | ICD-10-CM | POA: Diagnosis not present

## 2021-11-27 DIAGNOSIS — R739 Hyperglycemia, unspecified: Secondary | ICD-10-CM | POA: Diagnosis not present

## 2021-11-27 DIAGNOSIS — R82998 Other abnormal findings in urine: Secondary | ICD-10-CM | POA: Diagnosis not present

## 2021-11-27 DIAGNOSIS — Z1331 Encounter for screening for depression: Secondary | ICD-10-CM | POA: Diagnosis not present

## 2021-12-23 DIAGNOSIS — H52223 Regular astigmatism, bilateral: Secondary | ICD-10-CM | POA: Diagnosis not present

## 2021-12-23 DIAGNOSIS — Z9842 Cataract extraction status, left eye: Secondary | ICD-10-CM | POA: Diagnosis not present

## 2021-12-23 DIAGNOSIS — Z9841 Cataract extraction status, right eye: Secondary | ICD-10-CM | POA: Diagnosis not present

## 2021-12-23 DIAGNOSIS — H353132 Nonexudative age-related macular degeneration, bilateral, intermediate dry stage: Secondary | ICD-10-CM | POA: Diagnosis not present

## 2022-01-19 DIAGNOSIS — Z85828 Personal history of other malignant neoplasm of skin: Secondary | ICD-10-CM | POA: Diagnosis not present

## 2022-01-19 DIAGNOSIS — L570 Actinic keratosis: Secondary | ICD-10-CM | POA: Diagnosis not present

## 2022-01-19 DIAGNOSIS — L821 Other seborrheic keratosis: Secondary | ICD-10-CM | POA: Diagnosis not present

## 2022-01-19 DIAGNOSIS — L814 Other melanin hyperpigmentation: Secondary | ICD-10-CM | POA: Diagnosis not present

## 2022-01-19 DIAGNOSIS — D1801 Hemangioma of skin and subcutaneous tissue: Secondary | ICD-10-CM | POA: Diagnosis not present

## 2022-01-19 DIAGNOSIS — D2262 Melanocytic nevi of left upper limb, including shoulder: Secondary | ICD-10-CM | POA: Diagnosis not present

## 2022-02-04 DIAGNOSIS — I129 Hypertensive chronic kidney disease with stage 1 through stage 4 chronic kidney disease, or unspecified chronic kidney disease: Secondary | ICD-10-CM | POA: Diagnosis not present

## 2022-02-04 DIAGNOSIS — E538 Deficiency of other specified B group vitamins: Secondary | ICD-10-CM | POA: Diagnosis not present

## 2022-02-04 DIAGNOSIS — N189 Chronic kidney disease, unspecified: Secondary | ICD-10-CM | POA: Diagnosis not present

## 2022-02-04 DIAGNOSIS — M1990 Unspecified osteoarthritis, unspecified site: Secondary | ICD-10-CM | POA: Diagnosis not present

## 2022-02-04 DIAGNOSIS — Z7982 Long term (current) use of aspirin: Secondary | ICD-10-CM | POA: Diagnosis not present

## 2022-02-04 DIAGNOSIS — K5900 Constipation, unspecified: Secondary | ICD-10-CM | POA: Diagnosis not present

## 2022-02-04 DIAGNOSIS — E785 Hyperlipidemia, unspecified: Secondary | ICD-10-CM | POA: Diagnosis not present

## 2022-03-24 DIAGNOSIS — M25511 Pain in right shoulder: Secondary | ICD-10-CM | POA: Diagnosis not present

## 2022-05-18 IMAGING — CT CT ABD-PELV W/O CM
1 of 2 series · 13 of 32 positions shown, 18 images · non-contrast
Comparison: None

CLINICAL DATA: Hematuria and pelvic region pain

EXAM:
CT ABDOMEN AND PELVIS WITHOUT CONTRAST
TECHNIQUE: Multidetector CT imaging of the abdomen and pelvis was performed
following the standard protocol without oral or IV contrast.

[Series 2: renal standard/full · axial · 0.80mm/px · z∈[-506,-91]mm · 13 of 95 slices shown, 18 images]
[im 6/95  soft-tissue]
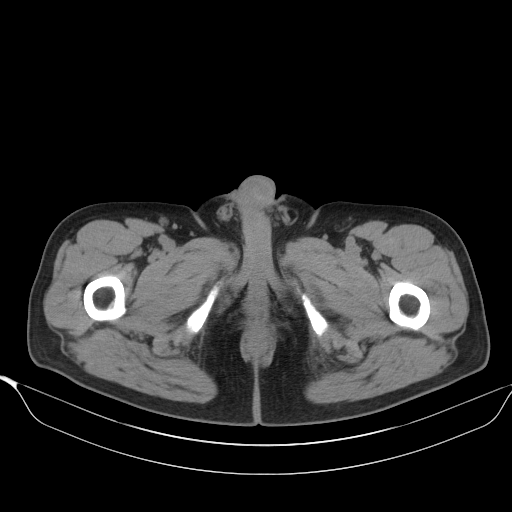
[im 6/95  bone]
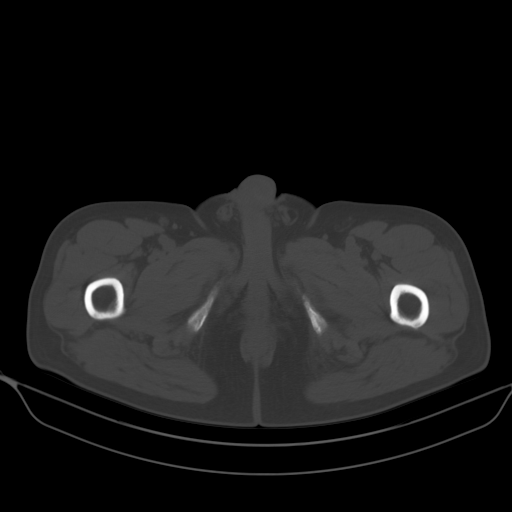
[im 16/95  soft-tissue]
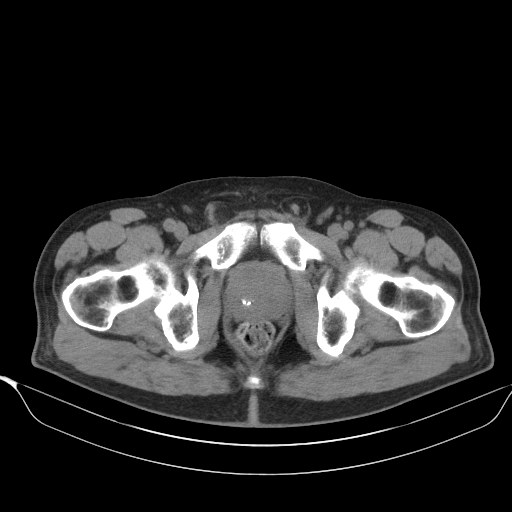
[im 21/95  soft-tissue]
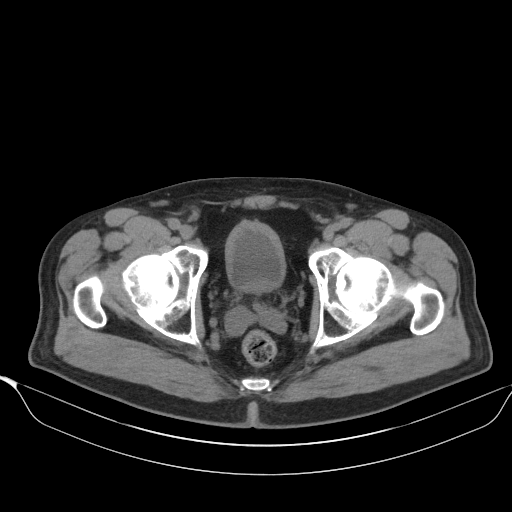
[im 27/95  soft-tissue]
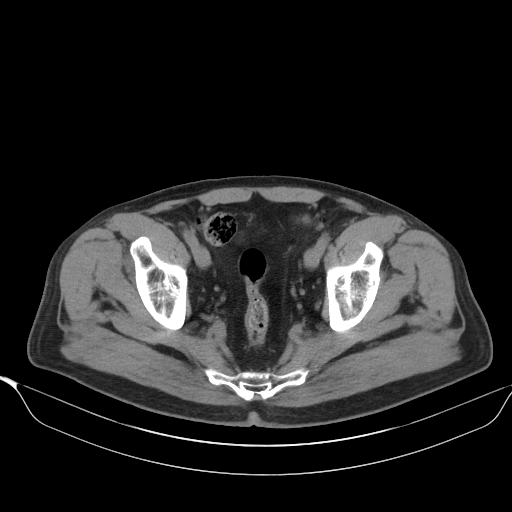
[im 37/95  soft-tissue]
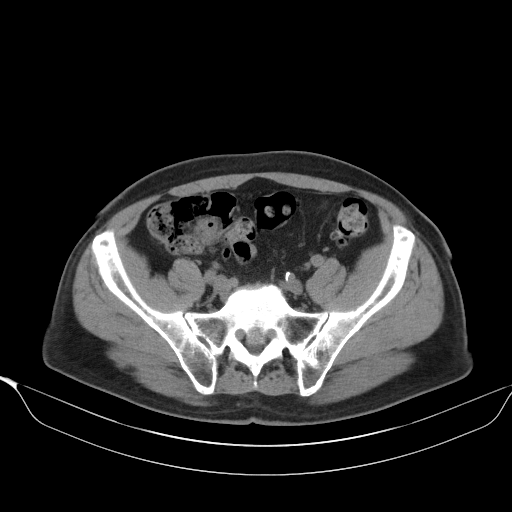
[im 42/95  soft-tissue]
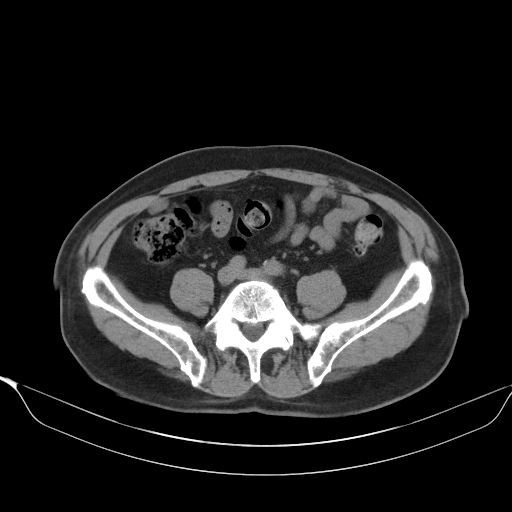
[im 53/95  soft-tissue]
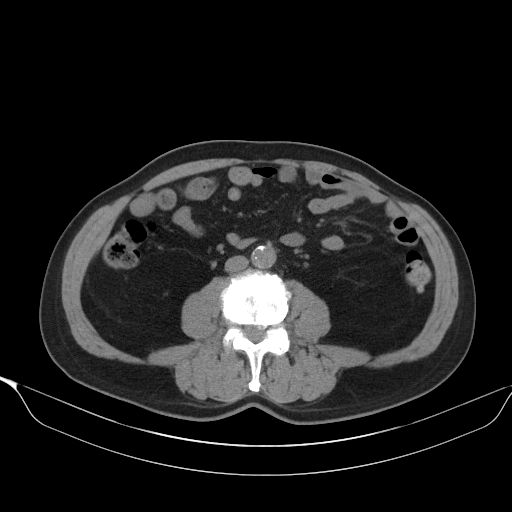
[im 58/95  soft-tissue]
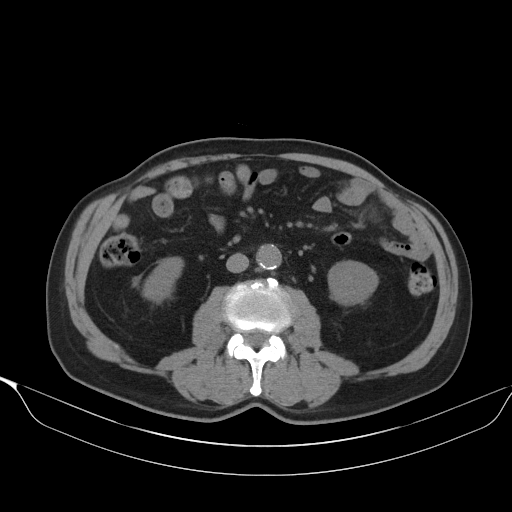
[im 68/95  soft-tissue]
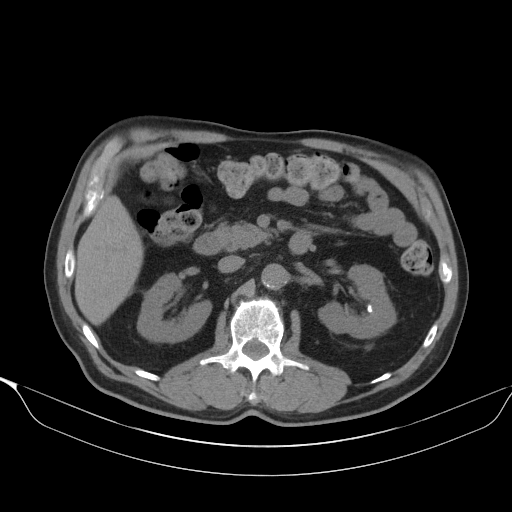
[im 68/95  bone]
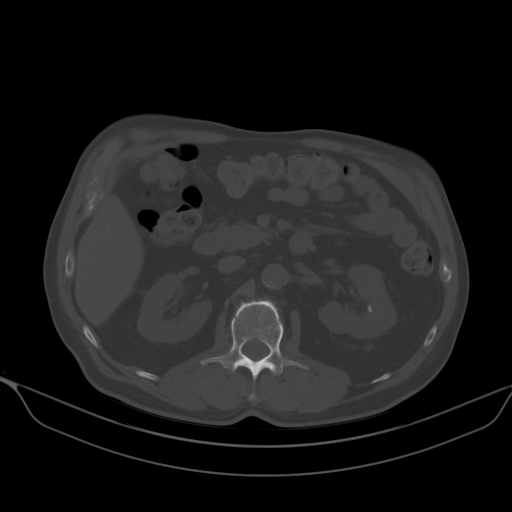
[im 74/95  soft-tissue]
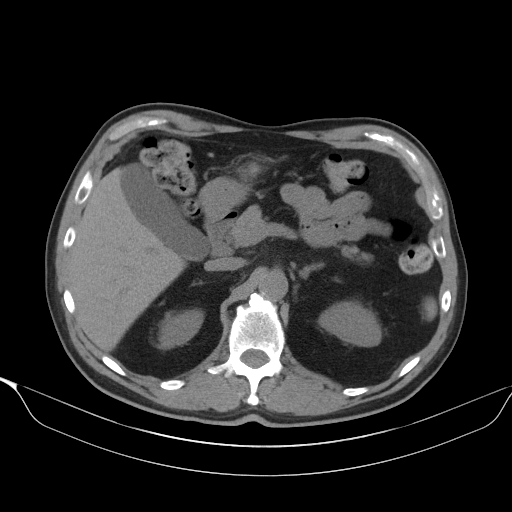
[im 74/95  lung]
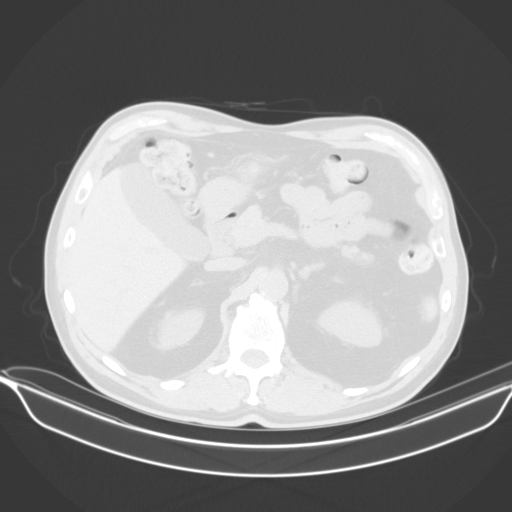
[im 79/95  soft-tissue]
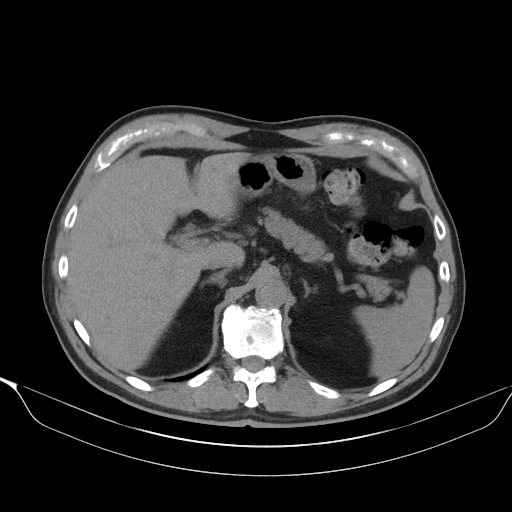
[im 79/95  lung]
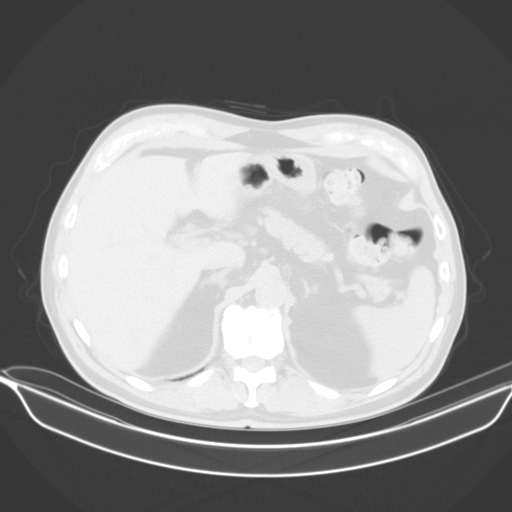
[im 84/95  lung]
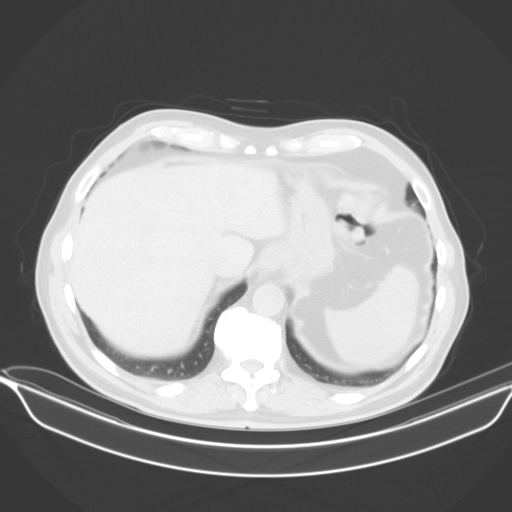
[im 89/95  soft-tissue]
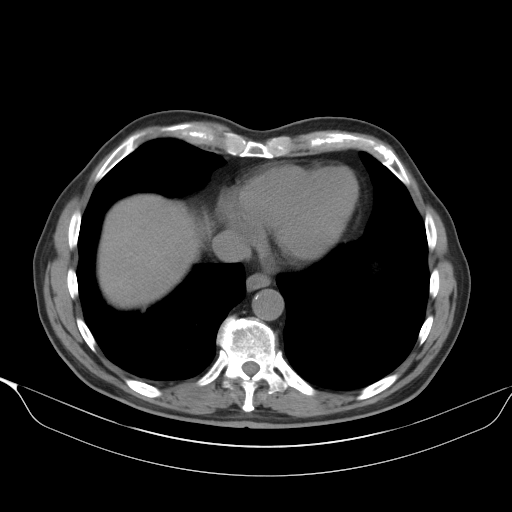
[im 89/95  lung]
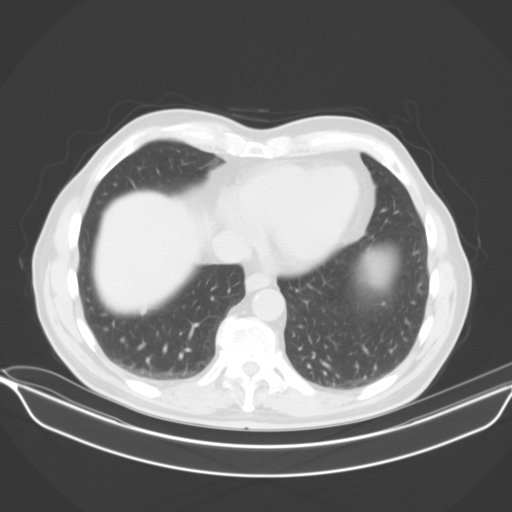

[13 of 32 positions shown; findings below may reference images not displayed]

FINDINGS: Lower chest: Lung bases are clear.

Hepatobiliary: There is a 6 mm probable cyst in the dome of the
liver near the junction of the right and left lobes. Tiny
calcification near the gallbladder likely represents a tiny
granuloma. No other focal liver lesion evident on this noncontrast
enhanced study. Gallbladder wall is not appreciably thickened. There
is no biliary duct dilatation.

Pancreas: There is no pancreatic mass or inflammatory focus.

Spleen: No splenic lesions evident.

Adrenals/Urinary Tract: Adrenals bilaterally appear normal. There is
mild scarring in the left kidney posteriorly. There are apparent
parapelvic cysts in the left kidney, largest measuring 2.1 x 2.0 cm.
There is no appreciable hydronephrosis on either side. On the right,
there is a 4 x 3 mm calculus in the lower pole region. There is a 1
mm calculus in the mid right kidney with a 2 x 2 mm calculus in the
upper pole region. On the left, there is a calculus in the upper
pole region measuring 8 x 5 mm. There is a 5 x 4 mm calculus in the
mid left kidney. There is no appreciable ureteral calculus on either
side. There is generalized urinary bladder wall thickening.

Stomach/Bowel: There is no appreciable bowel wall or mesenteric
thickening. There are descending colonic and sigmoid diverticula
without diverticulitis. There is moderate stool throughout the
colon. There is no evident bowel obstruction. The terminal ileum
appears normal. There is no appreciable free air or portal venous
air.

Vascular/Lymphatic: There is no abdominal aortic aneurysm. There is
aortic and iliac artery atherosclerotic calcification. There is no
appreciable adenopathy in the abdomen or pelvis.

Reproductive: There are prostatic calculi. Prostate is enlarged.
Seminal vesicles are borderline prominent as well.

Other: Appendix appears normal. There is no evident abscess or
ascites in the abdomen or pelvis. There is fat in each inguinal
ring.

Musculoskeletal: There are foci of degenerative change in the lower
thoracic and lumbar regions. There are no blastic or lytic bone
lesions. No intramuscular lesions are appreciable.
IMPRESSION: 1. Diffuse urinary bladder wall thickening consistent with cystitis.

2. Nonobstructing calculi in each kidney. No ureteral calculus or
hydronephrosis appreciable on either side.

3. Prominent prostate and seminal vesicles. This finding warrants
clinical assessment and PSA evaluation.

4. Descending colonic and sigmoid diverticulosis without
diverticulitis. No bowel wall thickening or bowel obstruction. No
abscess in the abdomen or pelvis. Appendix appears normal.

5.  Aortic Atherosclerosis ([id number]).

## 2022-05-19 NOTE — Progress Notes (Unsigned)
Chief Complaint  Patient presents with   New Patient (Initial Visit)    Establish cardiac care   History of Present Illness: 81 yo male with history of hyperlipidemia, HTN and nephrolithiasis who is here today as a new consult, referred by Dr. Virgina Jock, to evaluate cardiac risk. He had a normal stress test in 2018 in workup of chest pain. He has known cervical disc disease. He tells me today that he has not had chest pain. He has pain in his upper back that is controlled with Neurontin. He has no dyspnea on exertion, palpitations or LE edema. No dizziness, near syncope syncope.   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     atorvastatin (LIPITOR) 10 MG tablet Take 10 mg by mouth daily.     FOLPLEX 2.2 2.2-25-0.5 MG TABS Take 1 tablet by mouth daily.  0   gabapentin (NEURONTIN) 100 MG capsule Take 300-400 mg by mouth 4 (four) times daily.     Multiple Vitamins-Minerals (PRESERVISION AREDS 2 PO) Take by mouth 2 (two) times daily.     ramipril (ALTACE) 10 MG capsule Take 10 mg by mouth daily.     No current facility-administered medications for this visit.    No Known Allergies  Social History   Socioeconomic History   Marital status: Married    Spouse name: Vaughan Basta    Number of children: 1   Years of education: 16   Highest education level: Not on file  Occupational History   Occupation: Retired-Accounting  Tobacco Use   Smoking status: Never   Smokeless tobacco: Never  Substance and Sexual Activity    Alcohol use: No    Alcohol/week: 0.0 standard drinks of alcohol   Drug use: No   Sexual activity: Not on file  Other Topics Concern   Not on file  Social History Narrative   Right handed    Lives with wife   Caffeine use: Dr Malachi Bonds daily   Social Determinants of Health   Financial Resource Strain: Not on file  Food Insecurity: Not on file  Transportation Needs: Not on file  Physical Activity: Not on file  Stress: Not on file  Social Connections: Not on file  Intimate Partner Violence: Not on file    Family History  Problem Relation Age of Onset   CVA Mother    Cancer - Other Father    Colon cancer Sister     Review of Systems:  As stated in the HPI and otherwise negative.   BP 124/70   Pulse 86   Ht '5\' 6"'$  (1.676 m)   Wt 150 lb 3.2 oz (68.1 kg)   SpO2 99%   BMI 24.24 kg/m   Physical Examination: General: Well developed, well nourished, NAD  HEENT: OP clear, mucus membranes moist  SKIN: warm, dry. No rashes. Neuro: No focal deficits  Musculoskeletal: Muscle strength 5/5  all ext  Psychiatric: Mood and affect normal  Neck: No JVD, no carotid bruits, no thyromegaly, no lymphadenopathy.  Lungs:Clear bilaterally, no wheezes, rhonci, crackles Cardiovascular: Regular rate and rhythm. Soft systolic murmur.  Abdomen:Soft. Bowel sounds present. Non-tender.  Extremities: No lower extremity edema. Pulses are 2 + in the bilateral DP/PT.  EKG:  EKG is ordered today. The ekg ordered today demonstrates NSR  Recent Labs: No results found for requested labs within last 365 days.   Lipid Panel    Component Value Date/Time   CHOL 121 09/19/2016 0327   TRIG 141 09/19/2016 0327   HDL 31 (L) 09/19/2016 0327   CHOLHDL 3.9 09/19/2016 0327   VLDL 28 09/19/2016 0327   LDLCALC 62 09/19/2016 0327     Wt Readings from Last 3 Encounters:  05/20/22 150 lb 3.2 oz (68.1 kg)  08/22/19 162 lb 8 oz (73.7 kg)  05/01/18 155 lb (70.3 kg)      Assessment and Plan:   1. Systolic  murmur: Will arrange an echo to assess.   2. Cardiovascular screening: He has no worrisome cardiac symptoms. EKG is normal. Exam overall normal except for soft murmur. I do not think he needs stress testing. BP is controlled. Lipids controlled.   Labs/ tests ordered today include:   Orders Placed This Encounter  Procedures   EKG 12-Lead   ECHOCARDIOGRAM COMPLETE   Disposition:   F/U with me in one year   Signed, Lauree Chandler, MD, [facility] 05/20/2022 10:19 AM    [address], [place], [place]  [address] Phone: [phone]; Fax: [phone]

## 2022-05-20 ENCOUNTER — Ambulatory Visit: Payer: Medicare PPO | Attending: Cardiovascular Disease | Admitting: Cardiovascular Disease

## 2022-05-20 ENCOUNTER — Encounter: Payer: Self-pay | Admitting: Cardiovascular Disease

## 2022-05-20 VITALS — BP 124/70 | HR 86 | Ht 66.0 in | Wt 150.2 lb

## 2022-05-20 DIAGNOSIS — R011 Cardiac murmur, unspecified: Secondary | ICD-10-CM

## 2022-05-20 NOTE — Patient Instructions (Signed)
Medication Instructions:  No changes *If you need a refill on your cardiac medications before your next appointment, please call your pharmacy*   Lab Work: none If you have labs (blood work) drawn today and your tests are completely normal, you will receive your results only by: [place] (if you have MyChart) OR A paper copy in the mail If you have any lab test that is abnormal or we need to change your treatment, we will call you to review the results.   Testing/Procedures: Your physician has requested that you have an echocardiogram. Echocardiography is a painless test that uses sound waves to create images of your heart. It provides your doctor with information about the size and shape of your heart and how well your heart's chambers and valves are working. This procedure takes approximately one hour. There are no restrictions for this procedure. Please do NOT wear cologne, perfume, aftershave, or lotions (deodorant is allowed). Please arrive 15 minutes prior to your appointment time.   Follow-Up: At [facility], you and your health needs are our priority.  As part of our continuing mission to provide you with exceptional heart care, we have created designated Provider Care Teams.  These Care Teams include your primary Cardiologist (physician) and Advanced Practice Providers (APPs -  Physician Assistants and Nurse Practitioners) who all work together to provide you with the care you need, when you need it.  We recommend signing up for the patient portal called "MyChart".  Sign up information is provided on this After Visit Summary.  MyChart is used to connect with patients for Virtual Visits (Telemedicine).  Patients are able to view lab/test results, encounter notes, upcoming appointments, etc.  Non-urgent messages can be sent to your provider as well.   To learn more about what you can do with MyChart, go to [email or web address].    Your next appointment:   12  month(s)  The format for your next appointment:   In Person  Provider:   Lauree Chandler, MD   Important Information About Sugar

## 2022-06-02 DIAGNOSIS — H35372 Puckering of macula, left eye: Secondary | ICD-10-CM | POA: Diagnosis not present

## 2022-06-02 DIAGNOSIS — H43813 Vitreous degeneration, bilateral: Secondary | ICD-10-CM | POA: Diagnosis not present

## 2022-06-02 DIAGNOSIS — H47011 Ischemic optic neuropathy, right eye: Secondary | ICD-10-CM | POA: Diagnosis not present

## 2022-06-09 DIAGNOSIS — R310 Gross hematuria: Secondary | ICD-10-CM | POA: Diagnosis not present

## 2022-06-09 DIAGNOSIS — K769 Liver disease, unspecified: Secondary | ICD-10-CM | POA: Diagnosis not present

## 2022-06-09 DIAGNOSIS — N200 Calculus of kidney: Secondary | ICD-10-CM | POA: Diagnosis not present

## 2022-06-09 DIAGNOSIS — R109 Unspecified abdominal pain: Secondary | ICD-10-CM | POA: Diagnosis not present

## 2022-06-09 DIAGNOSIS — K5730 Diverticulosis of large intestine without perforation or abscess without bleeding: Secondary | ICD-10-CM | POA: Diagnosis not present

## 2022-06-09 DIAGNOSIS — M160 Bilateral primary osteoarthritis of hip: Secondary | ICD-10-CM | POA: Diagnosis not present

## 2022-06-18 DIAGNOSIS — E538 Deficiency of other specified B group vitamins: Secondary | ICD-10-CM | POA: Diagnosis not present

## 2022-06-18 DIAGNOSIS — R011 Cardiac murmur, unspecified: Secondary | ICD-10-CM | POA: Diagnosis not present

## 2022-06-18 DIAGNOSIS — I6990 Unspecified sequelae of unspecified cerebrovascular disease: Secondary | ICD-10-CM | POA: Diagnosis not present

## 2022-06-18 DIAGNOSIS — E782 Mixed hyperlipidemia: Secondary | ICD-10-CM | POA: Diagnosis not present

## 2022-06-18 DIAGNOSIS — I129 Hypertensive chronic kidney disease with stage 1 through stage 4 chronic kidney disease, or unspecified chronic kidney disease: Secondary | ICD-10-CM | POA: Diagnosis not present

## 2022-06-18 DIAGNOSIS — G629 Polyneuropathy, unspecified: Secondary | ICD-10-CM | POA: Diagnosis not present

## 2022-06-18 DIAGNOSIS — R319 Hematuria, unspecified: Secondary | ICD-10-CM | POA: Diagnosis not present

## 2022-06-18 DIAGNOSIS — N400 Enlarged prostate without lower urinary tract symptoms: Secondary | ICD-10-CM | POA: Diagnosis not present

## 2022-06-18 DIAGNOSIS — N1831 Chronic kidney disease, stage 3a: Secondary | ICD-10-CM | POA: Diagnosis not present

## 2022-06-19 ENCOUNTER — Ambulatory Visit (HOSPITAL_COMMUNITY): Payer: Medicare PPO | Attending: Cardiology

## 2022-06-19 DIAGNOSIS — R011 Cardiac murmur, unspecified: Secondary | ICD-10-CM | POA: Diagnosis not present

## 2022-06-19 LAB — ECHOCARDIOGRAM COMPLETE
Area-P 1/2: 3.48 cm2
S' Lateral: 2.2 cm

## 2022-06-22 ENCOUNTER — Other Ambulatory Visit: Payer: Self-pay | Admitting: Internal Medicine

## 2022-06-22 DIAGNOSIS — R748 Abnormal levels of other serum enzymes: Secondary | ICD-10-CM

## 2022-06-22 DIAGNOSIS — R7989 Other specified abnormal findings of blood chemistry: Secondary | ICD-10-CM

## 2022-06-24 ENCOUNTER — Ambulatory Visit
Admission: RE | Admit: 2022-06-24 | Discharge: 2022-06-24 | Disposition: A | Discharge status: Home / Self Care | Payer: Medicare PPO | Source: Ambulatory Visit | Attending: Internal Medicine | Admitting: Internal Medicine

## 2022-06-24 DIAGNOSIS — R748 Abnormal levels of other serum enzymes: Secondary | ICD-10-CM

## 2022-06-24 DIAGNOSIS — N281 Cyst of kidney, acquired: Secondary | ICD-10-CM | POA: Diagnosis not present

## 2022-06-24 DIAGNOSIS — R7989 Other specified abnormal findings of blood chemistry: Secondary | ICD-10-CM | POA: Diagnosis not present

## 2022-06-26 ENCOUNTER — Other Ambulatory Visit: Payer: Self-pay | Admitting: Internal Medicine

## 2022-06-26 DIAGNOSIS — N289 Disorder of kidney and ureter, unspecified: Secondary | ICD-10-CM

## 2022-07-01 DIAGNOSIS — E782 Mixed hyperlipidemia: Secondary | ICD-10-CM | POA: Diagnosis not present

## 2022-07-22 DIAGNOSIS — E782 Mixed hyperlipidemia: Secondary | ICD-10-CM | POA: Diagnosis not present

## 2022-07-22 DIAGNOSIS — R7989 Other specified abnormal findings of blood chemistry: Secondary | ICD-10-CM | POA: Diagnosis not present

## 2022-10-21 ENCOUNTER — Ambulatory Visit
Admission: RE | Admit: 2022-10-21 | Discharge: 2022-10-21 | Disposition: A | Discharge status: Home / Self Care | Payer: Medicare PPO | Source: Ambulatory Visit | Attending: Internal Medicine | Admitting: Internal Medicine

## 2022-10-21 DIAGNOSIS — N2889 Other specified disorders of kidney and ureter: Secondary | ICD-10-CM | POA: Diagnosis not present

## 2022-10-21 DIAGNOSIS — N289 Disorder of kidney and ureter, unspecified: Secondary | ICD-10-CM

## 2022-11-13 DIAGNOSIS — L570 Actinic keratosis: Secondary | ICD-10-CM | POA: Diagnosis not present

## 2022-11-13 DIAGNOSIS — S20462A Insect bite (nonvenomous) of left back wall of thorax, initial encounter: Secondary | ICD-10-CM | POA: Diagnosis not present

## 2022-11-13 DIAGNOSIS — Z85828 Personal history of other malignant neoplasm of skin: Secondary | ICD-10-CM | POA: Diagnosis not present

## 2022-12-03 DIAGNOSIS — Z125 Encounter for screening for malignant neoplasm of prostate: Secondary | ICD-10-CM | POA: Diagnosis not present

## 2022-12-03 DIAGNOSIS — E538 Deficiency of other specified B group vitamins: Secondary | ICD-10-CM | POA: Diagnosis not present

## 2022-12-03 DIAGNOSIS — R7989 Other specified abnormal findings of blood chemistry: Secondary | ICD-10-CM | POA: Diagnosis not present

## 2022-12-03 DIAGNOSIS — E782 Mixed hyperlipidemia: Secondary | ICD-10-CM | POA: Diagnosis not present

## 2022-12-03 DIAGNOSIS — K5900 Constipation, unspecified: Secondary | ICD-10-CM | POA: Diagnosis not present

## 2022-12-04 DIAGNOSIS — H35372 Puckering of macula, left eye: Secondary | ICD-10-CM | POA: Diagnosis not present

## 2022-12-04 DIAGNOSIS — H35362 Drusen (degenerative) of macula, left eye: Secondary | ICD-10-CM | POA: Diagnosis not present

## 2022-12-04 DIAGNOSIS — H47011 Ischemic optic neuropathy, right eye: Secondary | ICD-10-CM | POA: Diagnosis not present

## 2022-12-04 DIAGNOSIS — H43813 Vitreous degeneration, bilateral: Secondary | ICD-10-CM | POA: Diagnosis not present

## 2022-12-10 DIAGNOSIS — N1831 Chronic kidney disease, stage 3a: Secondary | ICD-10-CM | POA: Diagnosis not present

## 2022-12-10 DIAGNOSIS — Z0000 Encounter for general adult medical examination without abnormal findings: Secondary | ICD-10-CM | POA: Diagnosis not present

## 2022-12-10 DIAGNOSIS — I6990 Unspecified sequelae of unspecified cerebrovascular disease: Secondary | ICD-10-CM | POA: Diagnosis not present

## 2022-12-10 DIAGNOSIS — R82998 Other abnormal findings in urine: Secondary | ICD-10-CM | POA: Diagnosis not present

## 2022-12-10 DIAGNOSIS — R809 Proteinuria, unspecified: Secondary | ICD-10-CM | POA: Diagnosis not present

## 2022-12-10 DIAGNOSIS — R011 Cardiac murmur, unspecified: Secondary | ICD-10-CM | POA: Diagnosis not present

## 2022-12-10 DIAGNOSIS — E782 Mixed hyperlipidemia: Secondary | ICD-10-CM | POA: Diagnosis not present

## 2022-12-10 DIAGNOSIS — Z23 Encounter for immunization: Secondary | ICD-10-CM | POA: Diagnosis not present

## 2022-12-10 DIAGNOSIS — G629 Polyneuropathy, unspecified: Secondary | ICD-10-CM | POA: Diagnosis not present

## 2023-01-19 DIAGNOSIS — Z9842 Cataract extraction status, left eye: Secondary | ICD-10-CM | POA: Diagnosis not present

## 2023-01-19 DIAGNOSIS — R202 Paresthesia of skin: Secondary | ICD-10-CM | POA: Diagnosis not present

## 2023-01-19 DIAGNOSIS — M5416 Radiculopathy, lumbar region: Secondary | ICD-10-CM | POA: Diagnosis not present

## 2023-01-19 DIAGNOSIS — H52223 Regular astigmatism, bilateral: Secondary | ICD-10-CM | POA: Diagnosis not present

## 2023-01-19 DIAGNOSIS — G629 Polyneuropathy, unspecified: Secondary | ICD-10-CM | POA: Diagnosis not present

## 2023-01-19 DIAGNOSIS — Z9841 Cataract extraction status, right eye: Secondary | ICD-10-CM | POA: Diagnosis not present

## 2023-01-19 DIAGNOSIS — H353132 Nonexudative age-related macular degeneration, bilateral, intermediate dry stage: Secondary | ICD-10-CM | POA: Diagnosis not present

## 2023-01-20 DIAGNOSIS — L814 Other melanin hyperpigmentation: Secondary | ICD-10-CM | POA: Diagnosis not present

## 2023-01-20 DIAGNOSIS — L821 Other seborrheic keratosis: Secondary | ICD-10-CM | POA: Diagnosis not present

## 2023-01-20 DIAGNOSIS — L570 Actinic keratosis: Secondary | ICD-10-CM | POA: Diagnosis not present

## 2023-01-20 DIAGNOSIS — Z85828 Personal history of other malignant neoplasm of skin: Secondary | ICD-10-CM | POA: Diagnosis not present

## 2023-01-20 DIAGNOSIS — C44319 Basal cell carcinoma of skin of other parts of face: Secondary | ICD-10-CM | POA: Diagnosis not present

## 2023-01-20 DIAGNOSIS — D1801 Hemangioma of skin and subcutaneous tissue: Secondary | ICD-10-CM | POA: Diagnosis not present

## 2023-03-02 DIAGNOSIS — C44319 Basal cell carcinoma of skin of other parts of face: Secondary | ICD-10-CM | POA: Diagnosis not present

## 2023-03-02 DIAGNOSIS — Z85828 Personal history of other malignant neoplasm of skin: Secondary | ICD-10-CM | POA: Diagnosis not present

## 2023-05-21 DIAGNOSIS — I129 Hypertensive chronic kidney disease with stage 1 through stage 4 chronic kidney disease, or unspecified chronic kidney disease: Secondary | ICD-10-CM | POA: Diagnosis not present

## 2023-05-21 DIAGNOSIS — N1831 Chronic kidney disease, stage 3a: Secondary | ICD-10-CM | POA: Diagnosis not present

## 2023-05-21 DIAGNOSIS — R011 Cardiac murmur, unspecified: Secondary | ICD-10-CM | POA: Diagnosis not present

## 2023-05-21 DIAGNOSIS — N400 Enlarged prostate without lower urinary tract symptoms: Secondary | ICD-10-CM | POA: Diagnosis not present

## 2023-05-21 DIAGNOSIS — E782 Mixed hyperlipidemia: Secondary | ICD-10-CM | POA: Diagnosis not present

## 2023-05-21 DIAGNOSIS — D751 Secondary polycythemia: Secondary | ICD-10-CM | POA: Diagnosis not present

## 2023-05-21 DIAGNOSIS — R413 Other amnesia: Secondary | ICD-10-CM | POA: Diagnosis not present

## 2023-05-21 DIAGNOSIS — R809 Proteinuria, unspecified: Secondary | ICD-10-CM | POA: Diagnosis not present

## 2023-05-21 DIAGNOSIS — G629 Polyneuropathy, unspecified: Secondary | ICD-10-CM | POA: Diagnosis not present

## 2023-06-04 DIAGNOSIS — R3915 Urgency of urination: Secondary | ICD-10-CM | POA: Diagnosis not present

## 2023-06-18 DIAGNOSIS — H43813 Vitreous degeneration, bilateral: Secondary | ICD-10-CM | POA: Diagnosis not present

## 2023-06-18 DIAGNOSIS — H35362 Drusen (degenerative) of macula, left eye: Secondary | ICD-10-CM | POA: Diagnosis not present

## 2023-06-18 DIAGNOSIS — H47011 Ischemic optic neuropathy, right eye: Secondary | ICD-10-CM | POA: Diagnosis not present

## 2023-06-18 DIAGNOSIS — H04123 Dry eye syndrome of bilateral lacrimal glands: Secondary | ICD-10-CM | POA: Diagnosis not present

## 2023-06-18 DIAGNOSIS — H35372 Puckering of macula, left eye: Secondary | ICD-10-CM | POA: Diagnosis not present

## 2023-06-25 ENCOUNTER — Ambulatory Visit: Payer: Medicare PPO | Admitting: Cardiovascular Disease

## 2023-07-06 DIAGNOSIS — R3915 Urgency of urination: Secondary | ICD-10-CM | POA: Diagnosis not present

## 2023-07-06 DIAGNOSIS — R300 Dysuria: Secondary | ICD-10-CM | POA: Diagnosis not present

## 2023-08-12 DIAGNOSIS — R351 Nocturia: Secondary | ICD-10-CM | POA: Diagnosis not present

## 2023-08-12 DIAGNOSIS — N401 Enlarged prostate with lower urinary tract symptoms: Secondary | ICD-10-CM | POA: Diagnosis not present

## 2023-08-12 DIAGNOSIS — N3941 Urge incontinence: Secondary | ICD-10-CM | POA: Diagnosis not present

## 2023-08-12 DIAGNOSIS — R3912 Poor urinary stream: Secondary | ICD-10-CM | POA: Diagnosis not present

## 2023-08-30 ENCOUNTER — Encounter: Payer: Self-pay | Admitting: Cardiovascular Disease

## 2023-08-30 ENCOUNTER — Ambulatory Visit: Attending: Cardiovascular Disease | Admitting: Cardiovascular Disease

## 2023-08-30 VITALS — BP 138/74 | HR 65 | Ht 66.0 in | Wt 150.8 lb

## 2023-08-30 DIAGNOSIS — I340 Nonrheumatic mitral (valve) insufficiency: Secondary | ICD-10-CM

## 2023-08-30 DIAGNOSIS — I10 Essential (primary) hypertension: Secondary | ICD-10-CM

## 2023-08-30 NOTE — Patient Instructions (Signed)
Medication Instructions:  No changes *If you need a refill on your cardiac medications before your next appointment, please call your pharmacy*   Lab Work: none If you have labs (blood work) drawn today and your tests are completely normal, you will receive your results only by: [place] (if you have MyChart) OR A paper copy in the mail If you have any lab test that is abnormal or we need to change your treatment, we will call you to review the results.   Testing/Procedures: none   Follow-Up: At [facility], you and your health needs are our priority.  As part of our continuing mission to provide you with exceptional heart care, we have created designated Provider Care Teams.  These Care Teams include your primary Cardiologist (physician) and Advanced Practice Providers (APPs -  Physician Assistants and Nurse Practitioners) who all work together to provide you with the care you need, when you need it.  We recommend signing up for the patient portal called "MyChart".  Sign up information is provided on this After Visit Summary.  MyChart is used to connect with patients for Virtual Visits (Telemedicine).  Patients are able to view lab/test results, encounter notes, upcoming appointments, etc.  Non-urgent messages can be sent to your provider as well.   To learn more about what you can do with MyChart, go to [email or web address].    Your next appointment:   12 month(s)  Provider:   Lauree Chandler, MD     Other Instructions

## 2023-08-30 NOTE — Progress Notes (Signed)
 Chief Complaint  Patient presents with   Follow-up    Mitral regurgitation   History of Present Illness: 83 yo male with history of mitral regurgitation, hyperlipidemia, HTN and nephrolithiasis who is here today for follow up. He was seen in our office in December 2023 as a new patient for evaluation of cardiac risk. He had a normal stress test in 2018 in workup of chest pain. He has known cervical disc disease. Echo January 2024 with LVEF=60-65%. Mild MR.   He is here today for follow up. The patient denies any chest pain, dyspnea, palpitations, lower extremity edema, orthopnea, PND, dizziness, near syncope or syncope.   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     atorvastatin (LIPITOR) 10 MG tablet Take 10 mg by mouth daily.     donepezil (ARICEPT) 5 MG tablet Take 5 mg by mouth at bedtime.     FOLPLEX 2.2 2.2-25-0.5 MG TABS Take 1 tablet by mouth daily.  0   gabapentin (NEURONTIN) 100 MG capsule Take 300-400 mg by mouth 4 (four) times daily.     GEMTESA 75 MG TABS Take 1 tablet by mouth daily.     Multiple Vitamins-Minerals (PRESERVISION AREDS 2 PO) Take by mouth 2 (two) times daily.     ramipril (ALTACE) 10 MG capsule Take 10 mg by mouth daily.     tamsulosin (FLOMAX) 0.4 MG CAPS capsule Take 0.4 mg by mouth daily.     No current facility-administered medications for this visit.    No Known Allergies  Social History   Socioeconomic History   Marital status: Married    Spouse name: Bonita Quin     Number of children: 1   Years of education: 16   Highest education level: Not on file  Occupational History   Occupation: Retired-Accounting  Tobacco Use   Smoking status: Never   Smokeless tobacco: Never  Substance and Sexual Activity   Alcohol use: No    Alcohol/week: 0.0 standard drinks of alcohol   Drug use: No   Sexual activity: Not on file  Other Topics Concern   Not on file  Social History Narrative   Right handed    Lives with wife   Caffeine use: Dr Reino Kent daily   Social Drivers of Health   Financial Resource Strain: Not on file  Food Insecurity: Not on file  Transportation Needs: Not on file  Physical Activity: Not on file  Stress: Not on file  Social Connections: Not on file  Intimate Partner Violence: Not on file    Family History  Problem Relation Age of Onset   CVA Mother    Cancer - Other Father    Colon cancer Sister     Review of Systems:  As stated in the HPI and otherwise negative.   BP 138/74   Pulse 65   Ht 5\' 6"  (1.676 m)  Wt 68.4 kg   SpO2 95%   BMI 24.34 kg/m   Physical Examination: General: Well developed, well nourished, NAD  HEENT: OP clear, mucus membranes moist  SKIN: warm, dry. No rashes. Neuro: No focal deficits  Musculoskeletal: Muscle strength 5/5 all ext  Psychiatric: Mood and affect normal  Neck: No JVD, no carotid bruits, no thyromegaly, no lymphadenopathy.  Lungs:Clear bilaterally, no wheezes, rhonci, crackles Cardiovascular: Regular rate and rhythm. Soft systolic murmur.  Abdomen:Soft. Bowel sounds present. Non-tender.  Extremities: No lower extremity edema. Pulses are 2 + in the bilateral DP/PT.  EKG:  EKG is ordered today. The ekg ordered today demonstrates  EKG Interpretation Date/Time:  Monday August 30 2023 11:39:04 EDT Ventricular Rate:  70 PR Interval:  144 QRS Duration:  80 QT Interval:  366 QTC Calculation: 395 R Axis:   78  Text Interpretation: Sinus rhythm with Premature atrial complexes Confirmed  by Verne Carrow [phone]) on 08/30/2023 11:42:33 AM   Recent Labs: No results found for requested labs within last 365 days.   Lipid Panel    Component Value Date/Time   CHOL 121 09/19/2016 0327   TRIG 141 09/19/2016 0327   HDL 31 (L) 09/19/2016 0327   CHOLHDL 3.9 09/19/2016 0327   VLDL 28 09/19/2016 0327   LDLCALC 62 09/19/2016 0327     Wt Readings from Last 3 Encounters:  08/30/23 68.4 kg  05/20/22 68.1 kg  08/22/19 73.7 kg    Assessment and Plan:   1. Mitral regurgitation: Mild by echo in January 2024. Will follow  2. HTN: BP is controlled. Continue Ramipril   Labs/ tests ordered today include:   Orders Placed This Encounter  Procedures   EKG 12-Lead   Disposition:   F/U with me in one year   Signed, Verne Carrow, MD, [facility] 08/30/2023 12:01 PM    [facility] [address] [place], [place], [place]  [address] Phone: [phone]; Fax: [phone]

## 2023-10-11 ENCOUNTER — Ambulatory Visit: Payer: Medicare PPO | Admitting: Cardiovascular Disease

## 2023-10-15 DIAGNOSIS — N401 Enlarged prostate with lower urinary tract symptoms: Secondary | ICD-10-CM | POA: Diagnosis not present

## 2023-10-15 DIAGNOSIS — R3915 Urgency of urination: Secondary | ICD-10-CM | POA: Diagnosis not present

## 2023-11-22 DIAGNOSIS — I129 Hypertensive chronic kidney disease with stage 1 through stage 4 chronic kidney disease, or unspecified chronic kidney disease: Secondary | ICD-10-CM | POA: Diagnosis not present

## 2023-11-22 DIAGNOSIS — R42 Dizziness and giddiness: Secondary | ICD-10-CM | POA: Diagnosis not present

## 2023-11-22 DIAGNOSIS — I951 Orthostatic hypotension: Secondary | ICD-10-CM | POA: Diagnosis not present

## 2023-11-22 DIAGNOSIS — R413 Other amnesia: Secondary | ICD-10-CM | POA: Diagnosis not present

## 2023-12-07 DIAGNOSIS — I129 Hypertensive chronic kidney disease with stage 1 through stage 4 chronic kidney disease, or unspecified chronic kidney disease: Secondary | ICD-10-CM | POA: Diagnosis not present

## 2023-12-07 DIAGNOSIS — R739 Hyperglycemia, unspecified: Secondary | ICD-10-CM | POA: Diagnosis not present

## 2023-12-07 DIAGNOSIS — N1831 Chronic kidney disease, stage 3a: Secondary | ICD-10-CM | POA: Diagnosis not present

## 2023-12-07 DIAGNOSIS — E782 Mixed hyperlipidemia: Secondary | ICD-10-CM | POA: Diagnosis not present

## 2023-12-07 DIAGNOSIS — R319 Hematuria, unspecified: Secondary | ICD-10-CM | POA: Diagnosis not present

## 2023-12-07 DIAGNOSIS — E538 Deficiency of other specified B group vitamins: Secondary | ICD-10-CM | POA: Diagnosis not present

## 2023-12-07 DIAGNOSIS — N400 Enlarged prostate without lower urinary tract symptoms: Secondary | ICD-10-CM | POA: Diagnosis not present

## 2023-12-14 DIAGNOSIS — N1831 Chronic kidney disease, stage 3a: Secondary | ICD-10-CM | POA: Diagnosis not present

## 2023-12-14 DIAGNOSIS — R413 Other amnesia: Secondary | ICD-10-CM | POA: Diagnosis not present

## 2023-12-14 DIAGNOSIS — R82998 Other abnormal findings in urine: Secondary | ICD-10-CM | POA: Diagnosis not present

## 2023-12-14 DIAGNOSIS — Z0000 Encounter for general adult medical examination without abnormal findings: Secondary | ICD-10-CM | POA: Diagnosis not present

## 2023-12-14 DIAGNOSIS — Z1389 Encounter for screening for other disorder: Secondary | ICD-10-CM | POA: Diagnosis not present

## 2023-12-14 DIAGNOSIS — Z1331 Encounter for screening for depression: Secondary | ICD-10-CM | POA: Diagnosis not present

## 2023-12-14 DIAGNOSIS — N400 Enlarged prostate without lower urinary tract symptoms: Secondary | ICD-10-CM | POA: Diagnosis not present

## 2023-12-14 DIAGNOSIS — G629 Polyneuropathy, unspecified: Secondary | ICD-10-CM | POA: Diagnosis not present

## 2023-12-14 DIAGNOSIS — R809 Proteinuria, unspecified: Secondary | ICD-10-CM | POA: Diagnosis not present

## 2023-12-14 DIAGNOSIS — I129 Hypertensive chronic kidney disease with stage 1 through stage 4 chronic kidney disease, or unspecified chronic kidney disease: Secondary | ICD-10-CM | POA: Diagnosis not present

## 2023-12-14 DIAGNOSIS — I6990 Unspecified sequelae of unspecified cerebrovascular disease: Secondary | ICD-10-CM | POA: Diagnosis not present

## 2023-12-14 DIAGNOSIS — R011 Cardiac murmur, unspecified: Secondary | ICD-10-CM | POA: Diagnosis not present

## 2023-12-22 DIAGNOSIS — H04123 Dry eye syndrome of bilateral lacrimal glands: Secondary | ICD-10-CM | POA: Diagnosis not present

## 2023-12-22 DIAGNOSIS — H35372 Puckering of macula, left eye: Secondary | ICD-10-CM | POA: Diagnosis not present

## 2023-12-22 DIAGNOSIS — H35362 Drusen (degenerative) of macula, left eye: Secondary | ICD-10-CM | POA: Diagnosis not present

## 2023-12-22 DIAGNOSIS — H43813 Vitreous degeneration, bilateral: Secondary | ICD-10-CM | POA: Diagnosis not present

## 2023-12-22 DIAGNOSIS — H47011 Ischemic optic neuropathy, right eye: Secondary | ICD-10-CM | POA: Diagnosis not present

## 2024-01-13 ENCOUNTER — Encounter: Payer: Self-pay | Admitting: Physician Assistant

## 2024-01-15 ENCOUNTER — Emergency Department (HOSPITAL_BASED_OUTPATIENT_CLINIC_OR_DEPARTMENT_OTHER)
Admission: EM | Admit: 2024-01-15 | Discharge: 2024-01-15 | Disposition: A | Discharge status: Home / Self Care | Attending: Emergency Medicine | Admitting: Emergency Medicine

## 2024-01-15 ENCOUNTER — Other Ambulatory Visit: Payer: Self-pay

## 2024-01-15 ENCOUNTER — Encounter (HOSPITAL_BASED_OUTPATIENT_CLINIC_OR_DEPARTMENT_OTHER): Payer: Self-pay | Admitting: Emergency Medicine

## 2024-01-15 DIAGNOSIS — Z7982 Long term (current) use of aspirin: Secondary | ICD-10-CM | POA: Insufficient documentation

## 2024-01-15 DIAGNOSIS — M79641 Pain in right hand: Secondary | ICD-10-CM | POA: Insufficient documentation

## 2024-01-15 DIAGNOSIS — M79632 Pain in left forearm: Secondary | ICD-10-CM | POA: Insufficient documentation

## 2024-01-15 DIAGNOSIS — M79643 Pain in unspecified hand: Secondary | ICD-10-CM

## 2024-01-15 DIAGNOSIS — M79642 Pain in left hand: Secondary | ICD-10-CM | POA: Diagnosis not present

## 2024-01-15 LAB — COMPREHENSIVE METABOLIC PANEL WITH GFR
ALT: 18 U/L (ref 0–44)
AST: 21 U/L (ref 15–41)
Albumin: 3.8 g/dL (ref 3.5–5.0)
Alkaline Phosphatase: 125 U/L (ref 38–126)
Anion gap: 10 (ref 5–15)
BUN: 12 mg/dL (ref 8–23)
CO2: 25 mmol/L (ref 22–32)
Calcium: 9 mg/dL (ref 8.9–10.3)
Chloride: 106 mmol/L (ref 98–111)
Creatinine, Ser: 0.97 mg/dL (ref 0.61–1.24)
GFR, Estimated: >60 mL/min (ref >60)
Glucose, Bld: 119 mg/dL — ABNORMAL HIGH (ref 70–99)
Potassium: 3.8 mmol/L (ref 3.5–5.1)
Sodium: 140 mmol/L (ref 135–145)
Total Bilirubin: 0.5 mg/dL (ref 0.0–1.2)
Total Protein: 6.5 g/dL (ref 6.5–8.1)

## 2024-01-15 LAB — CBC WITH DIFFERENTIAL/PLATELET
Abs Immature Granulocytes: 0.02 K/uL (ref 0.00–0.07)
Basophils Absolute: 0 K/uL (ref 0.0–0.1)
Basophils Relative: 0 %
Eosinophils Absolute: 0 K/uL (ref 0.0–0.5)
Eosinophils Relative: 0 %
HCT: 39.7 % (ref 39.0–52.0)
Hemoglobin: 13.3 g/dL (ref 13.0–17.0)
Immature Granulocytes: 0 %
Lymphocytes Relative: 9 %
Lymphs Abs: 0.7 K/uL (ref 0.7–4.0)
MCH: 31.7 pg (ref 26.0–34.0)
MCHC: 33.5 g/dL (ref 30.0–36.0)
MCV: 94.7 fL (ref 80.0–100.0)
Monocytes Absolute: 0.7 K/uL (ref 0.1–1.0)
Monocytes Relative: 9 %
Neutro Abs: 6.2 K/uL (ref 1.7–7.7)
Neutrophils Relative %: 82 %
Platelets: 212 K/uL (ref 150–400)
RBC: 4.19 MIL/uL — ABNORMAL LOW (ref 4.22–5.81)
RDW: 13 % (ref 11.5–15.5)
WBC: 7.7 K/uL (ref 4.0–10.5)
nRBC: 0 % (ref 0.0–0.2)

## 2024-01-15 LAB — CK: Total CK: 95 U/L (ref 49–397)

## 2024-01-15 LAB — PROTIME-INR
INR: 1.3 — ABNORMAL HIGH (ref 0.8–1.2)
Prothrombin Time: 16.7 s — ABNORMAL HIGH (ref 11.4–15.2)

## 2024-01-15 NOTE — ED Triage Notes (Signed)
 C/o bilateral hand swelling x 1 day, bilateral shoulder pain when raising arms up x 2 weeks, and bilateral calf pain x 5 days.  Denies injuries.

## 2024-01-15 NOTE — Discharge Instructions (Signed)
 Use Tylenol  as needed for pain.  Follow-up with your doctor next week.  Return here for increased swelling, uncontrolled discomfort, bruising that starts on the rest your body, or any other problems

## 2024-01-15 NOTE — ED Provider Notes (Signed)
 [place] EMERGENCY DEPARTMENT AT [facility] Provider Note   CSN: [id number] Arrival date & time: 01/15/24  1130     Patient presents with: Hand Swelling   JYMIR DUNAJ is a 83 y.o. male.   83 year old male presents with 24-hour history of swelling to the dorsal surface of his right hand as well as some tenderness at the distal left forearm.  Symptoms began this morning when he awoke.  They are atraumatic.  Denies any history of blood thinner use.  Denies any other bruising noted.  No new medications appreciated.  Has had some cramping in his legs bilaterally.  No treatment use prior to arrival       Prior to Admission medications   Medication Sig Start Date End Date Taking? Authorizing Provider  aspirin  325 MG tablet Take 325 mg by mouth daily.    [provider]  atorvastatin  (LIPITOR) 10 MG tablet Take 10 mg by mouth daily.    [provider]  donepezil (ARICEPT) 5 MG tablet Take 5 mg by mouth at bedtime.    [provider]  FOLPLEX 2.2 2.2-25-0.5 MG TABS Take 1 tablet by mouth daily. 08/29/16   [provider]  gabapentin (NEURONTIN) 100 MG capsule Take 300-400 mg by mouth 4 (four) times daily.    [provider]  GEMTESA 75 MG TABS Take 1 tablet by mouth daily. 08/13/23   [provider]  Multiple Vitamins-Minerals (PRESERVISION AREDS 2 PO) Take by mouth 2 (two) times daily.    [provider]  ramipril  (ALTACE ) 10 MG capsule Take 10 mg by mouth daily.    [provider]  tamsulosin (FLOMAX) 0.4 MG CAPS capsule Take 0.4 mg by mouth daily. 08/12/23   [provider]    Allergies: Patient has no known allergies.    Review of Systems  All other systems reviewed and are negative.   Updated Vital Signs BP (!) 152/80 (BP Location: Right Arm)   Pulse 88   Temp 98.7 F (37.1 C) (Oral)   Resp 18   SpO2 100%   Physical Exam Vitals and nursing note reviewed.  Constitutional:       General: He is not in acute distress.    Appearance: Normal appearance. He is well-developed. He is not toxic-appearing.  HENT:     Head: Normocephalic and atraumatic.  Eyes:     General: Lids are normal.     Conjunctiva/sclera: Conjunctivae normal.     Pupils: Pupils are equal, round, and reactive to light.  Neck:     Thyroid: No thyroid mass.     Trachea: No tracheal deviation.  Cardiovascular:     Rate and Rhythm: Normal rate and regular rhythm.     Heart sounds: Normal heart sounds. No murmur heard.    No gallop.  Pulmonary:     Effort: Pulmonary effort is normal. No respiratory distress.     Breath sounds: Normal breath sounds. No stridor. No decreased breath sounds, wheezing, rhonchi or rales.  Abdominal:     General: There is no distension.     Palpations: Abdomen is soft.     Tenderness: There is no abdominal tenderness. There is no rebound.  Musculoskeletal:        General: Normal range of motion.     Left forearm: Tenderness present. No swelling or edema.     Right hand: Swelling present. No tenderness. Normal range of motion.     Cervical back: Normal range of motion and  neck supple.  Skin:    General: Skin is warm and dry.     Findings: No abrasion or rash.     Comments: Ecchymosis noted to dorsal surface of right hand without evidence of swelling.  Neurological:     Mental Status: He is alert and oriented to person, place, and time. Mental status is at baseline.     GCS: GCS eye subscore is 4. GCS verbal subscore is 5. GCS motor subscore is 6.     Cranial Nerves: No cranial nerve deficit.     Sensory: No sensory deficit.     Motor: Motor function is intact.  Psychiatric:        Attention and Perception: Attention normal.        Speech: Speech normal.        Behavior: Behavior normal.     (all labs ordered are listed, but only abnormal results are displayed) Labs Reviewed - No data to display  EKG: None  Radiology: No results found.   Procedures    Medications Ordered in the ED - No data to display                                  Medical Decision Making Amount and/or Complexity of Data Reviewed Labs: ordered.   Patient with atraumatic pain to his hand as well as arm.  No indication for imaging at this time.  Consider possible etiologies such as myositis but CK was normal.  Consider electrolyte disturbances but electrolytes are within normal limits.  His platelets are also normal 2.  INR is normal.  Low suspicion for clotting abnormality.  Patient be discharged with return precautions and to follow-up with his doctor next week     Final diagnoses:  None    ED Discharge Orders     None          Dasie Faden, MD 01/15/24 1259

## 2024-01-17 DIAGNOSIS — M79641 Pain in right hand: Secondary | ICD-10-CM | POA: Diagnosis not present

## 2024-01-17 DIAGNOSIS — M791 Myalgia, unspecified site: Secondary | ICD-10-CM | POA: Diagnosis not present

## 2024-01-17 DIAGNOSIS — R5383 Other fatigue: Secondary | ICD-10-CM | POA: Diagnosis not present

## 2024-01-20 DIAGNOSIS — L570 Actinic keratosis: Secondary | ICD-10-CM | POA: Diagnosis not present

## 2024-01-20 DIAGNOSIS — Z85828 Personal history of other malignant neoplasm of skin: Secondary | ICD-10-CM | POA: Diagnosis not present

## 2024-01-20 DIAGNOSIS — L814 Other melanin hyperpigmentation: Secondary | ICD-10-CM | POA: Diagnosis not present

## 2024-01-20 DIAGNOSIS — L821 Other seborrheic keratosis: Secondary | ICD-10-CM | POA: Diagnosis not present

## 2024-01-20 DIAGNOSIS — D225 Melanocytic nevi of trunk: Secondary | ICD-10-CM | POA: Diagnosis not present

## 2024-02-04 DIAGNOSIS — H52223 Regular astigmatism, bilateral: Secondary | ICD-10-CM | POA: Diagnosis not present

## 2024-02-04 DIAGNOSIS — Z9842 Cataract extraction status, left eye: Secondary | ICD-10-CM | POA: Diagnosis not present

## 2024-02-04 DIAGNOSIS — Z9841 Cataract extraction status, right eye: Secondary | ICD-10-CM | POA: Diagnosis not present

## 2024-02-04 DIAGNOSIS — H353132 Nonexudative age-related macular degeneration, bilateral, intermediate dry stage: Secondary | ICD-10-CM | POA: Diagnosis not present

## 2024-02-16 DIAGNOSIS — M79641 Pain in right hand: Secondary | ICD-10-CM | POA: Diagnosis not present

## 2024-02-16 DIAGNOSIS — M79642 Pain in left hand: Secondary | ICD-10-CM | POA: Diagnosis not present

## 2024-02-18 DIAGNOSIS — R5383 Other fatigue: Secondary | ICD-10-CM | POA: Diagnosis not present

## 2024-02-18 DIAGNOSIS — M79641 Pain in right hand: Secondary | ICD-10-CM | POA: Diagnosis not present

## 2024-02-18 DIAGNOSIS — M791 Myalgia, unspecified site: Secondary | ICD-10-CM | POA: Diagnosis not present

## 2024-02-18 DIAGNOSIS — R413 Other amnesia: Secondary | ICD-10-CM | POA: Diagnosis not present

## 2024-02-18 DIAGNOSIS — F419 Anxiety disorder, unspecified: Secondary | ICD-10-CM | POA: Diagnosis not present

## 2024-02-25 DIAGNOSIS — M25562 Pain in left knee: Secondary | ICD-10-CM | POA: Diagnosis not present

## 2024-02-25 DIAGNOSIS — M545 Low back pain, unspecified: Secondary | ICD-10-CM | POA: Diagnosis not present

## 2024-02-25 DIAGNOSIS — M25561 Pain in right knee: Secondary | ICD-10-CM | POA: Diagnosis not present

## 2024-03-17 DIAGNOSIS — M25561 Pain in right knee: Secondary | ICD-10-CM | POA: Diagnosis not present

## 2024-03-17 DIAGNOSIS — M25562 Pain in left knee: Secondary | ICD-10-CM | POA: Diagnosis not present

## 2024-03-17 DIAGNOSIS — M545 Low back pain, unspecified: Secondary | ICD-10-CM | POA: Diagnosis not present

## 2024-03-17 DIAGNOSIS — G8929 Other chronic pain: Secondary | ICD-10-CM | POA: Diagnosis not present

## 2024-03-21 DIAGNOSIS — M545 Low back pain, unspecified: Secondary | ICD-10-CM | POA: Diagnosis not present

## 2024-03-23 NOTE — Progress Notes (Incomplete)
 Assessment/Plan:     [name] is a very pleasant 83 y.o. year old RH male with a history of hypertension, hyperlipidemia, chronic low back, CKD,  chronic pain, anxiety, secondary polycythemia seen today for evaluation of memory loss. MoCA today is .  Etiology is unclear, workup is in progress.  Patient is able to participate on ADLs. He was on donepezil 10 mg daily but discontinued the medicine because it was of no help..   Memory Impairment of unclear etiology, concern for ***  MRI brain without contrast to assess for underlying structural abnormality and assess vascular load  Neurocognitive testing to further evaluate cognitive concerns and determine other underlying cause of memory changes, including potential contribution from sleep, anxiety, attention, or depression among others  Recommend good control of cardiovascular risk factors.   Continue to control mood as per PCP Folllow up in ***months   Subjective:    The patient is accompanied by ***  who supplements  the history.    How long did patient have memory difficulties?  For about.  He reports word finding diffficulties, decreased concentration, cannot finish a thought. Patient reports some difficulty remembering new information, recent conversations, names. repeats oneself?  Endorsed Disoriented when walking into a room? Denies ***  Leaving objects in unusual places?  Denies.   Wandering behavior? Denies.   Any personality changes, or depression, anxiety? Denies *** Hallucinations or paranoia? Denies.   Seizures? Denies.    Any sleep changes?  Sleeps well with trazodone. *** frequent nightmares or dream reenactment, other REM behavior or sleepwalking   Sleep apnea? Denies.   Any hygiene concerns?  Denies.   Independent of bathing and dressing? Endorsed  Does the patient need help with medications?  is in charge *** Who is in charge of the finances?  is in charge   *** Any changes in appetite?   Denies. ***    Patient have trouble swallowing?  Denies.   Does the patient cook? No*** yes, denies forgetting common recipes or kitchen accidents *** Any headaches?  Denies.   Chronic pain? Endorsed, low back, followed by Ortho  Ambulates with difficulty? Denies. ***  Needs a cane*** Needs a walker *** to ambulate for stability.   Recent falls or head injuries? Denies.     Vision changes?  Denies any new issues.  Has a history of history of ischemuc optic neuropathy*** Any strokelike symptoms? Denies.  He has a history of polyneuropathy, controlled with gabapentin Any tremors? Denies. *** Any anosmia? Denies.   Any incontinence of urine? Endorsed, on Gemtesa. History of recurrent UTIs, BPH Any bowel dysfunction? Intermittent constipation      Patient lives with ***  History of heavy alcohol  intake? Denies.   History of heavy tobacco use? Denies.   Family history of dementia?   6 cpusins with dementia  Does patient drive? No longer drives  *** yes, denies getting lost.***   Pertinent labs:  B12 [id number]***  MRI brain personally reviewed: ***  No Known Allergies  Current Outpatient Medications  Medication Instructions   aspirin  325 mg, Daily   atorvastatin  (LIPITOR) 10 mg, Daily   donepezil (ARICEPT) 5 mg, Daily at bedtime   FOLPLEX 2.2 2.2-25-0.5 MG TABS 1 tablet, Daily   gabapentin (NEURONTIN) 300-400 mg, 4 times daily   GEMTESA 75 MG TABS 1 tablet, Daily   Multiple Vitamins-Minerals (PRESERVISION AREDS 2 PO) 2 times daily   ramipril  (ALTACE ) 10 mg, Daily   tamsulosin (FLOMAX) 0.4 mg, Daily  VITALS:  There were no vitals filed for this visit.   Physical Exam  :     No data to display              No data to display             HEENT:  Normocephalic, atraumatic.  The superficial temporal arteries are without ropiness or tenderness. Cardiovascular: Regular rate and rhythm. Lungs: Clear to auscultation bilaterally. Neck: There are no carotid bruits noted  bilaterally. Orientation:  Alert and oriented to person, place and not to time***. No aphasia or dysarthria. Fund of knowledge is appropriate. Recent and remote memory impaired.  Attention and concentration are reduced***.  Able to name objects and repeat phrases. *** Delayed recall  /5 .*** Cranial nerves: There is good facial symmetry. Extraocular muscles are intact and visual fields are full to confrontational testing. Speech is fluent and clear. No tongue deviation. Hearing is intact to conversational tone.*** Tone: Tone is good throughout. Sensation: Sensation is intact to light touch.  Vibration is intact at the bilateral big toe.  Coordination: The patient has no difficulty with RAM's or FNF bilaterally. Normal finger to nose  Motor: Strength is 5/5 in the bilateral upper and lower extremities. There is no pronator drift. There are no fasciculations noted. DTR's: Deep tendon reflexes are 2/4 bilaterally. Gait and Station: The patient is able to ambulate without difficulty. Gait is cautious and narrow. Stride length is normal. ***      Thank you for allowing us  the opportunity to participate in the care of this nice patient. Please do not hesitate to contact us  for any questions or concerns.   Total time spent on today's visit was *** minutes dedicated to this patient today, preparing to see patient, examining the patient, ordering tests and/or medications and counseling the patient, documenting clinical information in the EHR or other health record, independently interpreting results and communicating results to the patient/family, discussing treatment and goals, answering patient's questions and coordinating care.  Cc:  Onita Rush, MD  Camie Sevin 03/27/2024 7:14 AM

## 2024-03-27 ENCOUNTER — Encounter: Payer: Self-pay | Admitting: Physician Assistant

## 2024-03-27 ENCOUNTER — Ambulatory Visit: Admitting: Physician Assistant

## 2024-03-27 ENCOUNTER — Ambulatory Visit

## 2024-03-31 DIAGNOSIS — M7918 Myalgia, other site: Secondary | ICD-10-CM | POA: Diagnosis not present

## 2024-03-31 DIAGNOSIS — M545 Low back pain, unspecified: Secondary | ICD-10-CM | POA: Diagnosis not present

## 2024-04-06 ENCOUNTER — Other Ambulatory Visit: Payer: Self-pay | Admitting: Internal Medicine

## 2024-04-06 ENCOUNTER — Other Ambulatory Visit (HOSPITAL_COMMUNITY): Payer: Self-pay | Admitting: Internal Medicine

## 2024-04-06 ENCOUNTER — Ambulatory Visit (HOSPITAL_BASED_OUTPATIENT_CLINIC_OR_DEPARTMENT_OTHER)
Admission: RE | Admit: 2024-04-06 | Discharge: 2024-04-06 | Disposition: A | Discharge status: Home / Self Care | Source: Ambulatory Visit | Attending: Internal Medicine | Admitting: Internal Medicine

## 2024-04-06 ENCOUNTER — Ambulatory Visit (HOSPITAL_BASED_OUTPATIENT_CLINIC_OR_DEPARTMENT_OTHER): Admission: RE | Admit: 2024-04-06 | Source: Ambulatory Visit

## 2024-04-06 DIAGNOSIS — N202 Calculus of kidney with calculus of ureter: Secondary | ICD-10-CM | POA: Diagnosis not present

## 2024-04-06 DIAGNOSIS — R634 Abnormal weight loss: Secondary | ICD-10-CM

## 2024-04-06 DIAGNOSIS — I129 Hypertensive chronic kidney disease with stage 1 through stage 4 chronic kidney disease, or unspecified chronic kidney disease: Secondary | ICD-10-CM | POA: Diagnosis not present

## 2024-04-06 DIAGNOSIS — K7689 Other specified diseases of liver: Secondary | ICD-10-CM | POA: Insufficient documentation

## 2024-04-06 DIAGNOSIS — K8020 Calculus of gallbladder without cholecystitis without obstruction: Secondary | ICD-10-CM | POA: Insufficient documentation

## 2024-04-06 DIAGNOSIS — I700 Atherosclerosis of aorta: Secondary | ICD-10-CM | POA: Diagnosis not present

## 2024-04-06 DIAGNOSIS — R5383 Other fatigue: Secondary | ICD-10-CM | POA: Diagnosis not present

## 2024-04-06 DIAGNOSIS — E440 Moderate protein-calorie malnutrition: Secondary | ICD-10-CM | POA: Diagnosis not present

## 2024-04-06 DIAGNOSIS — M545 Low back pain, unspecified: Secondary | ICD-10-CM | POA: Diagnosis not present

## 2024-04-06 DIAGNOSIS — K5730 Diverticulosis of large intestine without perforation or abscess without bleeding: Secondary | ICD-10-CM | POA: Insufficient documentation

## 2024-04-06 DIAGNOSIS — F419 Anxiety disorder, unspecified: Secondary | ICD-10-CM | POA: Diagnosis not present

## 2024-04-06 DIAGNOSIS — N1831 Chronic kidney disease, stage 3a: Secondary | ICD-10-CM | POA: Diagnosis not present

## 2024-04-06 DIAGNOSIS — N281 Cyst of kidney, acquired: Secondary | ICD-10-CM | POA: Insufficient documentation

## 2024-04-06 DIAGNOSIS — M791 Myalgia, unspecified site: Secondary | ICD-10-CM | POA: Diagnosis not present

## 2024-04-06 DIAGNOSIS — F0390 Unspecified dementia without behavioral disturbance: Secondary | ICD-10-CM | POA: Diagnosis not present

## 2024-04-06 DIAGNOSIS — N201 Calculus of ureter: Secondary | ICD-10-CM | POA: Diagnosis not present

## 2024-04-06 DIAGNOSIS — Z23 Encounter for immunization: Secondary | ICD-10-CM | POA: Diagnosis not present

## 2024-04-06 MED ORDER — IOHEXOL 300 MG/ML  SOLN
100.0000 mL | Freq: Once | INTRAMUSCULAR | Status: AC | PRN
  Administered 2024-04-06: 85 mL via INTRAVENOUS

## 2024-05-02 DIAGNOSIS — F419 Anxiety disorder, unspecified: Secondary | ICD-10-CM | POA: Diagnosis not present

## 2024-05-02 DIAGNOSIS — E440 Moderate protein-calorie malnutrition: Secondary | ICD-10-CM | POA: Diagnosis not present

## 2024-05-02 DIAGNOSIS — R296 Repeated falls: Secondary | ICD-10-CM | POA: Diagnosis not present

## 2024-05-02 DIAGNOSIS — M791 Myalgia, unspecified site: Secondary | ICD-10-CM | POA: Diagnosis not present

## 2024-05-02 DIAGNOSIS — N1831 Chronic kidney disease, stage 3a: Secondary | ICD-10-CM | POA: Diagnosis not present

## 2024-05-02 DIAGNOSIS — R410 Disorientation, unspecified: Secondary | ICD-10-CM | POA: Diagnosis not present

## 2024-05-02 DIAGNOSIS — F0390 Unspecified dementia without behavioral disturbance: Secondary | ICD-10-CM | POA: Diagnosis not present

## 2024-05-02 DIAGNOSIS — I129 Hypertensive chronic kidney disease with stage 1 through stage 4 chronic kidney disease, or unspecified chronic kidney disease: Secondary | ICD-10-CM | POA: Diagnosis not present

## 2024-05-09 ENCOUNTER — Emergency Department
Admission: EM | Admit: 2024-05-09 | Discharge: 2024-05-09 | Disposition: A | Discharge status: Home / Self Care | Attending: Emergency Medicine | Admitting: Emergency Medicine

## 2024-05-09 ENCOUNTER — Emergency Department

## 2024-05-09 DIAGNOSIS — W08XXXA Fall from other furniture, initial encounter: Secondary | ICD-10-CM | POA: Diagnosis not present

## 2024-05-09 DIAGNOSIS — S0083XA Contusion of other part of head, initial encounter: Secondary | ICD-10-CM | POA: Diagnosis not present

## 2024-05-09 DIAGNOSIS — I10 Essential (primary) hypertension: Secondary | ICD-10-CM | POA: Insufficient documentation

## 2024-05-09 DIAGNOSIS — S199XXA Unspecified injury of neck, initial encounter: Secondary | ICD-10-CM | POA: Diagnosis not present

## 2024-05-09 DIAGNOSIS — W19XXXA Unspecified fall, initial encounter: Secondary | ICD-10-CM

## 2024-05-09 DIAGNOSIS — M47812 Spondylosis without myelopathy or radiculopathy, cervical region: Secondary | ICD-10-CM | POA: Diagnosis not present

## 2024-05-09 DIAGNOSIS — T148XXA Other injury of unspecified body region, initial encounter: Secondary | ICD-10-CM

## 2024-05-09 DIAGNOSIS — M5030 Other cervical disc degeneration, unspecified cervical region: Secondary | ICD-10-CM | POA: Diagnosis not present

## 2024-05-09 DIAGNOSIS — S0990XA Unspecified injury of head, initial encounter: Secondary | ICD-10-CM | POA: Diagnosis not present

## 2024-05-09 NOTE — Discharge Instructions (Signed)
 Please make sure to use your walker when you are ambulating.

## 2024-05-09 NOTE — ED Provider Notes (Signed)
 [facility] Provider Note    Event Date/Time   First MD Initiated Contact with Patient 05/09/24 1855     (approximate)   History   Fall   HPI  [name] is a 83 y.o. male with history of hypertension, hyperlipidemia, presenting with a fall.  Patient states that he has been unsteady, is able to be using a walker.  States that he slid and fell from an ottoman and hit his head.  No LOC.  No pain anywhere else.  He denies any infectious symptoms.  No chest pain or shortness of breath.  No headache, no new focal weakness or numbness.  Per independent chart from wife, he is not on any anticoagulation, states that he was sitting on the edge of the ottoman, after dog was barking and he tried to get up quickly to address the dog and fell.  On independent chart review, he was seen by cardiology in March, has an EF of 6065% with mild MR.  He denies any lightheadedness, chest pain or shortness of breath at this time.     Physical Exam   Triage Vital Signs: ED Triage Vitals  Encounter Vitals Group     BP 05/09/24 1854 (!) 148/72     Girls Systolic BP Percentile --      Girls Diastolic BP Percentile --      Boys Systolic BP Percentile --      Boys Diastolic BP Percentile --      Pulse Rate 05/09/24 1854 91     Resp 05/09/24 1854 18     Temp 05/09/24 1854 98.4 F (36.9 C)     Temp Source 05/09/24 1854 Oral     SpO2 05/09/24 1854 100 %     Weight 05/09/24 1855 125 lb (56.7 kg)     Height 05/09/24 1855 5' 6 (1.676 m)     Head Circumference --      Peak Flow --      Pain Score 05/09/24 1855 8     Pain Loc --      Pain Education --      Exclude from Growth Chart --     Most recent vital signs: Vitals:   05/09/24 1854  BP: (!) 148/72  Pulse: 91  Resp: 18  Temp: 98.4 F (36.9 C)  SpO2: 100%     General: Awake, no distress.  CV:  Good peripheral perfusion.  Resp:  Normal effort.  No thoracic cage tenderness Abd:  No distention.  Soft  nontender Other:  He does have a hematoma to his occiput, with an overlying abrasion, no open laceration, no active bleeding.  No facial deformities or tenderness, no midline spinal tenderness, full range of motion of all extremities are intact without bony tenderness.    ED Results / Procedures / Treatments   Labs (all labs ordered are listed, but only abnormal results are displayed) Labs Reviewed - No data to display   RADIOLOGY On my independent interpretation, CT head without obvious intracranial hemorrhage   PROCEDURES:  Critical Care performed: No  Procedures   MEDICATIONS ORDERED IN ED: Medications - No data to display   IMPRESSION / MDM / ASSESSMENT AND PLAN / ED COURSE  I reviewed the triage vital signs and the nursing notes.                              Differential diagnosis includes,  but is not limited to, contusion, strain, sprain, hematoma, intracranial hemorrhage.  Fall appears to be mechanical.  Will get CT head, cervical spine.  His Tdap was last obtained in 2016.  Will cover his abrasions.  Patient's presentation is most consistent with acute presentation with potential threat to life or bodily function.  Independent interpretation of imaging below.  Considered but no indication for inpatient admission at this time, he safe for outpatient management.  Will discharge with strict return precautions.  Shared decision making done with wife and patient and they are agreeable with this plan.  Discharge.    Clinical Course as of 05/09/24 1955  Tue May 09, 2024  1950 CT Head Wo Contrast IMPRESSION: 1. No acute intracranial abnormality. 2. Mild cerebral atrophy.   [TT]  1950 CT Cervical Spine Wo Contrast IMPRESSION: 1. No acute abnormality of the cervical spine related to the reported neck trauma. 2. Multilevel  degenerative disc disease and facet arthropathy.   [TT]    Clinical Course User Index [TT] Waymond Lorelle Cummins, MD     FINAL CLINICAL  IMPRESSION(S) / ED DIAGNOSES   Final diagnoses:  Fall, initial encounter  Abrasion  Hematoma     Rx / DC Orders   ED Discharge Orders     None        Note:  This document was prepared using Dragon voice recognition software and may include unintentional dictation errors.    Waymond Lorelle Cummins, MD 05/09/24 [id number]

## 2024-05-09 NOTE — ED Triage Notes (Signed)
 Pt presents to the ED via [facility] from home after fall. Pt had a fall at home. No LOC. Did hit head. No use of blood thinners. Hx of dementia. Pt was standing and states that his feet went out from under him. Pt A&Ox3 (person,place,situation)   BP 167/88 99% RA HR 98 CBG 145

## 2024-05-09 NOTE — ED Notes (Signed)
 Pt discharged to home, instructions and pain control reviewed.  Pt and spouse verbalized understanding, no questions voiced at this time.

## 2024-05-17 DIAGNOSIS — M79641 Pain in right hand: Secondary | ICD-10-CM | POA: Diagnosis not present

## 2024-05-17 DIAGNOSIS — I129 Hypertensive chronic kidney disease with stage 1 through stage 4 chronic kidney disease, or unspecified chronic kidney disease: Secondary | ICD-10-CM | POA: Diagnosis not present

## 2024-05-17 DIAGNOSIS — E785 Hyperlipidemia, unspecified: Secondary | ICD-10-CM | POA: Diagnosis not present

## 2024-05-17 DIAGNOSIS — E440 Moderate protein-calorie malnutrition: Secondary | ICD-10-CM | POA: Diagnosis not present

## 2024-05-17 DIAGNOSIS — M791 Myalgia, unspecified site: Secondary | ICD-10-CM | POA: Diagnosis not present

## 2024-05-17 DIAGNOSIS — F419 Anxiety disorder, unspecified: Secondary | ICD-10-CM | POA: Diagnosis not present

## 2024-05-17 DIAGNOSIS — F0390 Unspecified dementia without behavioral disturbance: Secondary | ICD-10-CM | POA: Diagnosis not present

## 2024-05-17 DIAGNOSIS — N1831 Chronic kidney disease, stage 3a: Secondary | ICD-10-CM | POA: Diagnosis not present

## 2024-05-22 ENCOUNTER — Telehealth: Payer: Self-pay

## 2024-05-22 DIAGNOSIS — R69 Illness, unspecified: Secondary | ICD-10-CM

## 2024-05-23 ENCOUNTER — Other Ambulatory Visit: Payer: Self-pay | Admitting: *Deleted

## 2024-05-25 ENCOUNTER — Telehealth: Payer: Self-pay | Admitting: *Deleted

## 2024-05-25 NOTE — Patient Instructions (Signed)
 Visit Information  Thank you for taking time to visit with me today. Please don't hesitate to contact me if I can be of assistance to you before our next scheduled appointment.  Our next appointment is by telephone on 06/19/24 at 1pm Please call the care guide team at [phone] if you need to cancel or reschedule your appointment.   Following is a copy of your care plan:   Goals Addressed             This Visit's Progress    VBCI Social Work Care Plan       Problems:   Cognitive Deficits  CSW Clinical Goal(s):   Over the next 90 days the Caregiver will work with Child Psychotherapist to address concerns related to facility care options and additional community supports to assist with patient's care. Over the next 30 days caregiver will review facility care list for possible options for memory care once received by mail Interventions:  Dementia Care:   Current level of care: Home with other family or significant other(s): spouse  Evaluation of patient safety in current living environment and review of Dementia resources and support ADL's Confirmed that patient has private duty care 2x per week  Facility  Assessed needs and reviewed facility placement process; as well as the different levels of care Emotional Support Provided-caregiver stress acknowledged Confirmed plan to provided a list of facilities based on level of care needs- will be sent by mail for review Reviewed facility placement process  Confirmed caregiver's plan to continue to work with family attorney regarding asset management   Patient Goals/Self-Care Activities:  Caregiver to review facility care list for possible placement options  Plan:   Telephone follow up appointment with care management team member scheduled for:  06/19/24 1pm        Please call the Suicide and Crisis Lifeline: 988 call the [place]  National Suicide Prevention Lifeline: 229-733-8176 or TTY: 228-506-0126 TTY 539-860-5177) to talk to a  trained counselor call 1-800-273-TALK (toll free, 24 hour hotline) call 911 if you are experiencing a Mental Health or Behavioral Health Crisis or need someone to talk to.  Patient's spouse verbalized understanding of Care plan and visit instructions communicated this visit  Lanea Vankirk, LCSW [place]  [facility], [facility] Licensed Clinical Social Worker  Direct Dial: [phone]

## 2024-05-25 NOTE — Patient Outreach (Signed)
 Contacted by patient's provider to request assistance with placement options, aggressive behaviors increasing.  Phone call to patient's spouse who expressed safety concerns-patient beginning to wander outside  Per patient, she has spoken with [facility]. They do not have any beds in their memory care at this time but did have options.  Patient's spouse has a meeting on Tuesday at [facility] to discuss options further. Continues to consider [facility], however [facility] is first choice. Patient has system on the doors and cameras for safety. Continues to have private duty care.  Patient's niece is coming tonight to spend the night for additional support.  CSW to provide patient's provider with Fl2 for facility care.  Kimaria Struthers, LCSW [place]  [facility], [facility] Licensed Clinical Social Worker  Direct Dial: [phone]

## 2024-05-25 NOTE — Patient Outreach (Signed)
 Complex Care Management   Visit Note  05/25/2024  Name:  [name] MRN: [id number] DOB: [birth year]  Situation: Referral received for Complex Care Management related to dementia support I obtained verbal consent from Caregiver.  Visit completed with Caregiver  on the phone on 05/23/24.  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  Combative behaviors in the past, however medication is beneficial for sleep. Patient has an aid that assists 2x per week, continues to consider facility care options. Patient Reported Symptoms:  Cognitive Cognitive Status: Difficulties with attention and concentration, Poor judgment in daily scenarios      Neurological Neurological Review of Symptoms: No symptoms reported    HEENT HEENT Symptoms Reported: No symptoms reported      Cardiovascular Cardiovascular Symptoms Reported: No symptoms reported    Respiratory Respiratory Symptoms Reported: No symptoms reported    Endocrine Endocrine Symptoms Reported: No symptoms reported Is patient diabetic?: No    Gastrointestinal Gastrointestinal Symptoms Reported: No symptoms reported      Genitourinary Genitourinary Symptoms Reported: No symptoms reported    Integumentary Integumentary Symptoms Reported: No symptoms reported    Musculoskeletal Musculoskelatal Symptoms Reviewed: Back pain Musculoskeletal Management Strategies: Medication therapy      Psychosocial Psychosocial Symptoms Reported: Irritability Additional Psychological Details: confims having private duty care 2x per week, neighbors assist when available, considering facility care in the future Behavioral Management Strategies: Adequate rest, Medication therapy Major Change/Loss/Stressor/Fears (CP): Medical  condition, family Techniques to [place] with Loss/Stress/Change: Medication Quality of Family Relationships: supportive, involved, helpful Do you feel physically threatened by others?: No    05/25/2024    PHQ2-9 Depression Screening   Little interest or pleasure in doing things Not at all  Feeling down, depressed, or hopeless Not at all  PHQ-2 - Total Score 0  Trouble falling or staying asleep, or sleeping too much    Feeling tired or having little energy    Poor appetite or overeating     Feeling bad about yourself - or that you are a failure or have let yourself or your family down    Trouble concentrating on things, such as reading the newspaper or watching television    Moving or speaking so slowly that other people could have noticed.  Or the opposite - being so fidgety or restless that you have been moving around a lot more than usual    Thoughts that you would be better off dead, or hurting yourself in some way    PHQ2-9 Total Score    If you checked off any problems, how difficult have these problems made it for you to do your work, take care of things at home, or get along with other people    Depression Interventions/Treatment      There were no vitals filed for this visit.    Medications Reviewed Today     Reviewed by Ermalinda Lenn HERO, LCSW (Social Worker) on 05/23/24 at 1028  Med List Status: <None>   Medication Order Taking? Sig Documenting Provider Last Dose Status Informant  aspirin  325 MG tablet [id number] Yes Take 325 mg by mouth daily. [provider]  Active Self  atorvastatin  (LIPITOR) 10 MG tablet [id number] Yes Take 10 mg by mouth daily. [provider]  Active Self  donepezil (ARICEPT) 5 MG tablet [id number] Yes Take 5 mg by mouth at bedtime. [provider]  Active   FOLPLEX 2.2 2.2-25-0.5 MG TABS [id number] Yes Take 1 tablet by mouth daily. [provider]  Active Self  gabapentin (NEURONTIN) 100 MG capsule [id number] Yes Take  300-400 mg by mouth 4 (four) times daily. [provider]  Active Self           Med Note BONNEY, EMMA L   Tue Aug 22, 2019  2:27 PM) Patient takes 400 mg qam, 300mg  in afternoon and 400 mg at night  GEMTESA 75 MG TABS [id number] Yes Take 1 tablet by mouth daily. [provider]  Active   Multiple Vitamins-Minerals (PRESERVISION AREDS 2 PO) [id number] Yes Take by mouth 2 (two) times daily. [provider]  Active Self  ramipril  (ALTACE ) 10 MG capsule [id number] Yes Take 10 mg by mouth daily. [provider]  Active Self  tamsulosin (FLOMAX) 0.4 MG CAPS capsule [id number] Yes Take 0.4 mg by mouth daily. [provider]  Active             Recommendation:   PCP Follow-up Specialty provider follow-up as scheduled Review facility care list once received  Follow Up Plan:   Telephone follow up appointment date/time:  06/19/24 1pm  Kayzlee Wirtanen, LCSW [place]  [facility], [facility] Licensed Clinical Social Worker  Direct Dial: [phone]

## 2024-05-28 ENCOUNTER — Emergency Department (HOSPITAL_COMMUNITY)

## 2024-05-28 ENCOUNTER — Emergency Department (HOSPITAL_COMMUNITY)
Admission: EM | Admit: 2024-05-28 | Source: Home / Self Care | Attending: Emergency Medicine | Admitting: Emergency Medicine

## 2024-05-28 ENCOUNTER — Other Ambulatory Visit: Payer: Self-pay

## 2024-05-28 DIAGNOSIS — Z7982 Long term (current) use of aspirin: Secondary | ICD-10-CM | POA: Insufficient documentation

## 2024-05-28 DIAGNOSIS — I10 Essential (primary) hypertension: Secondary | ICD-10-CM | POA: Insufficient documentation

## 2024-05-28 DIAGNOSIS — R2681 Unsteadiness on feet: Secondary | ICD-10-CM | POA: Diagnosis not present

## 2024-05-28 DIAGNOSIS — F02811 Dementia in other diseases classified elsewhere, unspecified severity, with agitation: Secondary | ICD-10-CM | POA: Diagnosis not present

## 2024-05-28 DIAGNOSIS — Z79899 Other long term (current) drug therapy: Secondary | ICD-10-CM | POA: Insufficient documentation

## 2024-05-28 DIAGNOSIS — R7989 Other specified abnormal findings of blood chemistry: Secondary | ICD-10-CM | POA: Insufficient documentation

## 2024-05-28 DIAGNOSIS — R410 Disorientation, unspecified: Secondary | ICD-10-CM | POA: Diagnosis not present

## 2024-05-28 DIAGNOSIS — R4182 Altered mental status, unspecified: Secondary | ICD-10-CM | POA: Diagnosis present

## 2024-05-28 DIAGNOSIS — F0390 Unspecified dementia without behavioral disturbance: Secondary | ICD-10-CM | POA: Diagnosis not present

## 2024-05-28 DIAGNOSIS — F03911 Unspecified dementia, unspecified severity, with agitation: Secondary | ICD-10-CM

## 2024-05-28 LAB — TROPONIN T, HIGH SENSITIVITY
Troponin T High Sensitivity: 39 ng/L — ABNORMAL HIGH (ref 0–19)
Troponin T High Sensitivity: 45 ng/L — ABNORMAL HIGH (ref 0–19)

## 2024-05-28 LAB — COMPREHENSIVE METABOLIC PANEL WITH GFR
ALT: 104 U/L — ABNORMAL HIGH (ref 0–44)
AST: 37 U/L (ref 15–41)
Albumin: 4 g/dL (ref 3.5–5.0)
Alkaline Phosphatase: 173 U/L — ABNORMAL HIGH (ref 38–126)
Anion gap: 9 (ref 5–15)
BUN: 12 mg/dL (ref 8–23)
CO2: 28 mmol/L (ref 22–32)
Calcium: 8.7 mg/dL — ABNORMAL LOW (ref 8.9–10.3)
Chloride: 105 mmol/L (ref 98–111)
Creatinine, Ser: 0.93 mg/dL (ref 0.61–1.24)
GFR, Estimated: >60 mL/min (ref >60)
Glucose, Bld: 91 mg/dL (ref 70–99)
Potassium: 3.7 mmol/L (ref 3.5–5.1)
Sodium: 142 mmol/L (ref 135–145)
Total Bilirubin: 1 mg/dL (ref 0.0–1.2)
Total Protein: 6.5 g/dL (ref 6.5–8.1)

## 2024-05-28 LAB — URINALYSIS, ROUTINE W REFLEX MICROSCOPIC
Bacteria, UA: NONE SEEN
Bilirubin Urine: NEGATIVE
Glucose, UA: NEGATIVE mg/dL
Hgb urine dipstick: NEGATIVE
Ketones, ur: NEGATIVE mg/dL
Leukocytes,Ua: NEGATIVE
Nitrite: NEGATIVE
Protein, ur: 30 mg/dL — AB
RBC / HPF: >50 RBC/hpf (ref 0–5)
Specific Gravity, Urine: 1.023 (ref 1.005–1.030)
pH: 5 (ref 5.0–8.0)

## 2024-05-28 LAB — CBC
HCT: 41.4 % (ref 39.0–52.0)
Hemoglobin: 13.7 g/dL (ref 13.0–17.0)
MCH: 32.9 pg (ref 26.0–34.0)
MCHC: 33.1 g/dL (ref 30.0–36.0)
MCV: 99.3 fL (ref 80.0–100.0)
Platelets: 209 K/uL (ref 150–400)
RBC: 4.17 MIL/uL — ABNORMAL LOW (ref 4.22–5.81)
RDW: 14.6 % (ref 11.5–15.5)
WBC: 9.6 K/uL (ref 4.0–10.5)
nRBC: 0 % (ref 0.0–0.2)

## 2024-05-28 LAB — CBG MONITORING, ED: Glucose-Capillary: 88 mg/dL (ref 70–99)

## 2024-05-28 MED ORDER — DONEPEZIL HCL 5 MG PO TABS
5.0000 mg | ORAL_TABLET | Freq: Every day | ORAL | Status: DC
  Administered 2024-05-28 – 2024-06-01 (×5): 5 mg via ORAL
  Filled 2024-05-28 (×5): qty 1

## 2024-05-28 MED ORDER — PYRIDOXINE HCL 25 MG PO TABS
25.0000 mg | ORAL_TABLET | Freq: Every day | ORAL | Status: DC
  Administered 2024-05-30 – 2024-06-01 (×3): 25 mg via ORAL
  Filled 2024-05-28 (×8): qty 1

## 2024-05-28 MED ORDER — FOLIC ACID 1 MG PO TABS
2.5000 mg | ORAL_TABLET | Freq: Every day | ORAL | Status: DC
  Administered 2024-05-30 – 2024-06-01 (×3): 2.5 mg via ORAL
  Filled 2024-05-28 (×5): qty 3

## 2024-05-28 MED ORDER — VITAMIN B-12 1000 MCG PO TABS
2000.0000 ug | ORAL_TABLET | Freq: Every day | ORAL | Status: DC
  Administered 2024-05-30 – 2024-06-01 (×3): 2000 ug via ORAL
  Filled 2024-05-28 (×7): qty 2

## 2024-05-28 MED ORDER — ASPIRIN 325 MG PO TBEC
325.0000 mg | DELAYED_RELEASE_TABLET | Freq: Every day | ORAL | Status: DC
  Administered 2024-05-30 – 2024-06-01 (×3): 325 mg via ORAL
  Filled 2024-05-28 (×8): qty 1

## 2024-05-28 MED ORDER — TAMSULOSIN HCL 0.4 MG PO CAPS
0.4000 mg | ORAL_CAPSULE | Freq: Every day | ORAL | Status: DC
  Administered 2024-05-30 – 2024-06-01 (×3): 0.4 mg via ORAL
  Filled 2024-05-28 (×7): qty 1

## 2024-05-28 MED ORDER — FA-PYRIDOXINE-CYANOCOBALAMIN 2.5-25-2 MG PO TABS
1.0000 | ORAL_TABLET | Freq: Every day | ORAL | Status: DC
  Filled 2024-05-28: qty 1

## 2024-05-28 MED ORDER — LACTATED RINGERS IV BOLUS
1000.0000 mL | Freq: Once | INTRAVENOUS | Status: AC
  Administered 2024-05-28: 1000 mL via INTRAVENOUS

## 2024-05-28 MED ORDER — QUETIAPINE FUMARATE 25 MG PO TABS
12.5000 mg | ORAL_TABLET | Freq: Every day | ORAL | Status: DC | PRN
  Administered 2024-05-28 – 2024-06-01 (×2): 12.5 mg via ORAL
  Filled 2024-05-28 (×2): qty 1

## 2024-05-28 MED ORDER — HALOPERIDOL LACTATE 5 MG/ML IJ SOLN
2.0000 mg | Freq: Once | INTRAMUSCULAR | Status: AC
  Administered 2024-05-29: 2 mg via INTRAVENOUS
  Filled 2024-05-28: qty 1

## 2024-05-28 MED ORDER — ATORVASTATIN CALCIUM 10 MG PO TABS
10.0000 mg | ORAL_TABLET | Freq: Every day | ORAL | Status: DC
  Administered 2024-05-28 – 2024-06-01 (×4): 10 mg via ORAL
  Filled 2024-05-28 (×6): qty 1

## 2024-05-28 MED ORDER — HALOPERIDOL LACTATE 5 MG/ML IJ SOLN
2.0000 mg | Freq: Once | INTRAMUSCULAR | Status: AC
  Administered 2024-05-28: 2 mg via INTRAVENOUS
  Filled 2024-05-28: qty 1

## 2024-05-28 MED ORDER — QUETIAPINE FUMARATE 25 MG PO TABS
25.0000 mg | ORAL_TABLET | Freq: Every day | ORAL | Status: DC
  Administered 2024-05-28 – 2024-06-01 (×5): 25 mg via ORAL
  Filled 2024-05-28 (×5): qty 1

## 2024-05-28 MED ORDER — QUETIAPINE FUMARATE 25 MG PO TABS
12.5000 mg | ORAL_TABLET | Freq: Two times a day (BID) | ORAL | Status: DC
  Administered 2024-05-29: 23:00:00 12.5 mg via ORAL
  Filled 2024-05-28 (×2): qty 1

## 2024-05-28 MED ORDER — RISPERIDONE 0.5 MG PO TABS
0.2500 mg | ORAL_TABLET | Freq: Every day | ORAL | Status: DC
  Administered 2024-05-28: 0.25 mg via ORAL
  Filled 2024-05-28: qty 1

## 2024-05-28 MED ORDER — MIRABEGRON ER 25 MG PO TB24
25.0000 mg | ORAL_TABLET | Freq: Every day | ORAL | Status: DC
  Administered 2024-05-30 – 2024-06-01 (×3): 25 mg via ORAL
  Filled 2024-05-28 (×7): qty 1

## 2024-05-28 NOTE — ED Notes (Signed)
 Pt very agitated after family left. Pt will not stay in bed, he is trying to take things apart. Looking for items not here. Not redirectable at this time.

## 2024-05-28 NOTE — ED Triage Notes (Addendum)
 PT arrives to triage via wheelchair accompanied by family. Pt has been confused over the past 4 days. Last night the pt was searching for his mother whom has been deceased since 45. Pt removed linens from bed, and disassembled a cabinet looking for dog that does not exist. This behavior is abnormal for the pt. Normal intake and output per family.   Recent medication changes, family reporting difficulty with med reconciliation.

## 2024-05-28 NOTE — Progress Notes (Signed)
 CSW received a consult for placement due to worsening dementia. CSW met with the pts spouse, Rock, and the pts friend, Omar. The pts spouse reported worsening hallucinations and aggressive behaviors at night. She stated that neither she nor the pt has slept for three nights due to these behaviors. She reported that the pts medications were adjusted; however, the pt showed no improvement. She also reported that the pt is having difficulty ambulating as well. The spouse stated she has been in contact with [facility] and is exploring placement options there. She reported that she does not feel like she can manage the pt at home at this time. She stated she will be meeting with [facility] on Tuesday to review available options, as their memory care unit is currently full. The pt is active with [facility] RN/PT. PA will place psychiatric and PT consults. ICM to follow.

## 2024-05-28 NOTE — ED Provider Notes (Signed)
 [facility] EMERGENCY DEPARTMENT AT [facility] Provider Note   CSN: [id number] Arrival date & time: 05/28/24  9062     Patient presents with: Altered Mental Status   [name] is a 83 y.o. male.   Patient is here for worsening altered mental status/dementia.  History provided by patient's wife and male family friend.  Patient has been suffering from dementia for some time.  Over the last 4 days, he has become aggressive with his wife.  Pushing her or grabbing her.  This aggression is more pronounced at nighttime.  She spoke with the patient's primary care provider on Friday who recommended increasing his risperidone  dosing.  This increase in dosing did not change the patient's behavior.  In the past when the patient has been aggressive his wife have been able to coax him back to bed.  She has not been able to do so over the last 4 days and she has been unable to sleep due to his behavior.  Wife had already been in the process of trying to find placement for her husband and herself in a nursing facility.  However, with the patient's recent persistent aggression it has become more prescient to find SNF placement for patient for his safety and his wife's safety.    Patient is not complaining of any pain or concerns at this time.  He believes he is here for evaluation and management of his medications.  He knows he is in [place] however he is unable to answer any other orientation questions correctly.  He is pleasant and nonaggressive currently. Wife denies any recent injuries or falls for the patient.  Wife denies any recent illness, fevers, or medical complaints from the patient.  The history is provided by a friend and the spouse.  Altered Mental Status      Prior to Admission medications  Medication Sig Start Date End Date Taking? Authorizing Provider  aspirin  325 MG tablet Take 325 mg by mouth at bedtime.   Yes [provider]  atorvastatin  (LIPITOR) 10 MG tablet  Take 10 mg by mouth every evening.   Yes [provider]  cyanocobalamin  (VITAMIN B12) 1000 MCG tablet Take 1,000 mcg by mouth daily.   Yes [provider]  gabapentin (NEURONTIN) 300 MG capsule Take 300 mg by mouth 4 (four) times daily.   Yes [provider]  GEMTESA 75 MG TABS Take 1 tablet by mouth daily. 08/13/23  Yes [provider]  Multiple Vitamins-Minerals (PRESERVISION AREDS 2 PO) Take 1 tablet by mouth 2 (two) times daily.   Yes [provider]  risperiDONE  (RISPERDAL ) 0.25 MG tablet Take 0.25-0.5 mg by mouth See admin instructions. Take 2 tablets (0.5mg ) by mouth at night, and 1 tablet (0.25mg ) in the afternoon. 05/22/24  Yes [provider]  tamsulosin  (FLOMAX ) 0.4 MG CAPS capsule Take 0.4 mg by mouth daily. 08/12/23  Yes [provider]  donepezil  (ARICEPT ) 5 MG tablet Take 5 mg by mouth at bedtime. Patient not taking: Reported on 05/28/2024    [provider]  FOLPLEX 2.2 2.2-25-0.5 MG TABS Take 1 tablet by mouth daily. Patient not taking: Reported on 05/28/2024 08/29/16   [provider]    Allergies: Patient has no known allergies.    Review of Systems  Updated Vital Signs BP (!) 182/104 (BP Location: Left Arm)   Pulse (!) 122 Comment: pt moving around and aggitated  Temp 98.5 F (36.9 C) (Oral)   Resp (!) 22  SpO2 99%   Physical Exam  (all labs ordered are listed, but only abnormal results are displayed) Labs Reviewed  COMPREHENSIVE METABOLIC PANEL WITH GFR - Abnormal; Notable for the following components:      Result Value   Calcium  8.7 (*)    ALT 104 (*)    Alkaline Phosphatase 173 (*)    All other components within normal limits  CBC - Abnormal; Notable for the following components:   RBC 4.17 (*)    All other components within normal limits  URINALYSIS, ROUTINE W REFLEX MICROSCOPIC - Abnormal; Notable for the following components:   Protein, ur 30 (*)    All other components  within normal limits  TROPONIN T, HIGH SENSITIVITY - Abnormal; Notable for the following components:   Troponin T High Sensitivity 39 (*)    All other components within normal limits  TROPONIN T, HIGH SENSITIVITY - Abnormal; Notable for the following components:   Troponin T High Sensitivity 45 (*)    All other components within normal limits  CBG MONITORING, ED    EKG: EKG Interpretation Date/Time:  Sunday May 28 2024 11:18:33 EST Ventricular Rate:  88 PR Interval:  147 QRS Duration:  82 QT Interval:  358 QTC Calculation: 434 R Axis:   59  Text Interpretation: Sinus rhythm Confirmed by Neysa Clap [id number]) on 05/28/2024 12:48:28 PM  Radiology: DG Chest Portable 1 View Result Date: 05/28/2024 CLINICAL DATA:  Altered mental status. EXAM: PORTABLE CHEST 1 VIEW COMPARISON:  09/18/2016 FINDINGS: The lungs are clear without focal pneumonia, edema, pneumothorax or pleural effusion. The cardiopericardial silhouette is within normal limits for size. No acute bony abnormality. Telemetry leads overlie the chest. IMPRESSION: No active disease. Electronically Signed   By: Camellia Candle M.D.   On: 05/28/2024 13:35     Procedures   Medications Ordered in the ED  aspirin  EC tablet 325 mg (325 mg Oral Not Given 05/28/24 1706)  atorvastatin  (LIPITOR) tablet 10 mg (10 mg Oral Given 05/28/24 1653)  donepezil  (ARICEPT ) tablet 5 mg (5 mg Oral Given 05/28/24 1921)  tamsulosin  (FLOMAX ) capsule 0.4 mg (0.4 mg Oral Not Given 05/28/24 1708)  mirabegron  ER (MYRBETRIQ ) tablet 25 mg (25 mg Oral Not Given 05/28/24 1707)  folic acid  (FOLVITE ) tablet 2.5 mg (2.5 mg Oral Not Given 05/28/24 1706)    And  pyridOXINE  (VITAMIN B6) tablet 25 mg (25 mg Oral Not Given 05/28/24 1707)    And  cyanocobalamin  (VITAMIN B12) tablet 2,000 mcg (2,000 mcg Oral Not Given 05/28/24 1707)  QUEtiapine  (SEROQUEL ) tablet 12.5 mg (has no administration in time range)  QUEtiapine  (SEROQUEL ) tablet 25 mg (25 mg Oral Given  05/28/24 1922)  QUEtiapine  (SEROQUEL ) tablet 12.5 mg (has no administration in time range)  lactated ringers  bolus 1,000 mL (0 mLs Intravenous Stopped 05/28/24 1521)  haloperidol  lactate (HALDOL ) injection 2 mg (2 mg Intravenous Given 05/28/24 1736)     Patient presents to the ED for concern of increased agitation at night, this involves an extensive number of treatment options, and is a complaint that carries with it a high risk of complications and morbidity.  The differential diagnosis includes progressing dementia, UTI, sepsis, MI, medication induced.   Lab Tests:  I Ordered, and personally interpreted labs.  The pertinent results include: Lab work and UA are unremarkable.  Initial troponin slightly elevated, trending troponin is not markedly elevated from the first.   Imaging Studies ordered:  I ordered imaging studies including chest x-ray I independently visualized and interpreted imaging  which showed no acute cardiopulmonary findings. I agree with the radiologist interpretation   Cardiac Monitoring:  The patient was maintained on a cardiac monitor.  I personally viewed and interpreted the cardiac monitored which showed an underlying rhythm of: Sinus rhythm with no evidence of STEMI   Medicines ordered and prescription drug management:  I ordered medication including LR bolus and Haldol  for presumed dehydration and in response to agitation as the patient was trying to leave. Reevaluation of the patient after these medicines showed that the patient improved I have reviewed the patients home medicines and have made adjustments as needed   Consultations Obtained:  I requested consultation with the social work: Assistance with finding SNF placement Psych and TTS: Medication recommendation   Problem List / ED Course:  Clinical Course as of 05/28/24 2153  [facility] May 28, 2024  1141 Troponin T, High Sensitivity [EA]    Clinical Course User Index [EA] Rosina Almarie LABOR,  PA-C    Progressing dementia with agitation.  No medical abnormalities found with lab work and imaging to explain increasing aggression in patient.  Consult to social work, psych, and TTS placed.  Family is requesting SNF placement and do not feel comfortable taking the patient home due to his continuing behavior.  Haldol  given in response to increase in agitation in patient with good results.  Home medications ordered.  Current plan is patient to board until SNF placement can be found.   Reevaluation:  After the interventions noted above, I reevaluated the patient and found that they have :improved   Dispostion:  Patient boarding while awaiting SNF placement.  Provider sign off so care transferred to [employer] PA-C.  Medical Decision Making Amount and/or Complexity of Data Reviewed Labs: ordered. Decision-making details documented in ED Course. Radiology: ordered.  Risk OTC drugs. Prescription drug management.   This note was produced using [employer]. While the provider has reviewed and verified all clinical information, transcription errors may remain.    Final diagnoses:  Dementia with agitation, unspecified dementia severity, unspecified dementia type [facility])    ED Discharge Orders     None          Rosina Almarie LABOR, PA-C 05/28/24 2154    Neysa Caron PARAS, DO 05/29/24 1700

## 2024-05-28 NOTE — ED Notes (Signed)
 Pt trying to leave at this time. Looking for wife.

## 2024-05-28 NOTE — ED Notes (Signed)
 Pt starting to get physically aggressive when I reach out to keep pt from falling

## 2024-05-28 NOTE — ED Notes (Signed)
 Pt refusing to stay in bed or chair. Getting hostile when trying to be directed to either but pt is unsteady on feet. Pt swats writer away while trying to provide assistance.

## 2024-05-28 NOTE — Consult Note (Signed)
 [facility] Psychiatric Consult Initial  Patient Name: .[name]  MRN: [id number]  DOB: [birth year]  Consult Order details:  Orders (From admission, onward)     Start     Ordered   05/28/24 1718  CONSULT TO CALL ACT TEAM       Ordering Provider: Rosina Almarie LABOR, PA-C  Provider:  (Not yet assigned)  Question:  Reason for Consult?  Answer:  Medication management   05/28/24 1717   05/28/24 1435  IP CONSULT TO PSYCHIATRY       Ordering Provider: Rosina Almarie LABOR, PA-C  Provider:  (Not yet assigned)  Question:  Reason for consult:  Answer:  Medication management   05/28/24 1434             Mode of Visit: In person    Psychiatry Consult Evaluation  Service Date: May 28, 2024 LOS:  LOS: 0 days  Chief Complaint   Primary Psychiatric Diagnoses  Major neurocognitive disorder - dementia    Assessment  [name] is a 83 y.o. male admitted: Presented to the ED on 05/28/2024  9:42 AM for worsening aggression and hallucinations. He carries the psychiatric diagnoses of anxiety and has a past medical history of dementia unspecified, hypertension, and hyperlipidemia.   His current presentation of aggression, confusion, and hallucinations is most consistent with Major neurocognitive disorder. He meets criteria for outpatient neurology based on inpatient psychiatric treatment is not recommended, as dementia is a chronic progressive condition and does not benefit from acute inpatient psychiatric interventions. Current outpatient psychotropic medications include risperidone  and historically he has had an ineffective response to these medications. He was compliant with medications prior to admission as evidenced by his wife reporting medication compliance.   On initial examination, patient is seated in no acute distress. He is alert and oriented to person. He is disoriented to place, time and situation. He answers questions appropriately and linear by responding using 1-3 words.  However, he does appear to have difficulty recalling information. His mood is euthymic and affect is appropriate. His speech is coherent at a decreased tone. He has fair eye contact. He was noted to be calm and cooperative on exam.  Please see plan below for detailed recommendations.   Diagnoses:  Active Hospital problems: Principal Problem:   Major neurocognitive disorder (HCC)    Plan   ## Psychiatric Medication Recommendations:  DC Risperdal  Start Seroquel  12.5 mg po BID for mood stabilization  Start Seroquel  25 mg po at bedtime for mood stabilization  Start Seroquel  12.5 mg po prn daily for agitation   ## Medical Decision Making Capacity: Not specifically addressed in this encounter  ## Further Work-up:  UDS  -- most recent EKG on 05/28/24 had QtC of 434 -- Pertinent labwork reviewed earlier this admission includes:    ## Disposition:-- Patient's case and medications discussed with Dr. Larina. Consult was placed for medication management. Inpatient psychiatric treatment is not recommended, as dementia is a chronic progressive condition and does not benefit from acute inpatient psychiatric interventions. Provider to adjust psychotropic medication to reduce agitation and hallucinations. The patient may benefit from a memory care unit that can provide a structured and supervised environment, along with ongoing treatment and supportive care. A TOC consult was ordered to assist with SNF placement.   ## Behavioral / Environmental: -Delirium Precautions: Delirium Interventions for Nursing and Staff: - RN to open blinds every AM. - To Bedside: Glasses, hearing aide, and pt's own shoes. Make available to patients. when  possible and encourage use. - Encourage po fluids when appropriate, keep fluids within reach. - OOB to chair with meals. - Passive ROM exercises to all extremities with AM & PM care. - RN to assess orientation to person, time and place QAM and PRN. - Recommend extended visitation  hours with familiar family/friends as feasible. - Staff to minimize disturbances at night. Turn off television when pt asleep or when not in use.    ## Safety and Observation Level:  - Based on my clinical evaluation, I estimate the patient to be at low risk of self harm in the current setting. - At this time, we recommend  1:1 Observation. This decision is based on my review of the chart including patient's history and current presentation, interview of the patient, mental status examination, and consideration of suicide risk including evaluating suicidal ideation, plan, intent, suicidal or self-harm behaviors, risk factors, and protective factors. This judgment is based on our ability to directly address suicide risk, implement suicide prevention strategies, and develop a safety plan while the patient is in the clinical setting. Please contact our team if there is a concern that risk level has changed.  CSSR Risk Category:C-SSRS RISK CATEGORY: No Risk  Suicide Risk Assessment: Patient has following modifiable risk factors for suicide: impulsive behaviors which we are addressing by adjusting medications and recommending outpatient neurology. Patient has following non-modifiable or demographic risk factors for suicide: male gender Patient has the following protective factors against suicide: Supportive family, no history of suicide attempts, and no history of NSSIB  Thank you for this consult request. Recommendations have been communicated to the primary team.  We will continue to follow to manage medication adjustments at this time.   Teresa Wyline CROME, NP       History of Present Illness  Relevant Aspects of Hospital ED Course: Admitted on 05/28/2024 for worsening aggression and hallucinations.   Per Rosina Norris, PA., note: Patient is here for worsening altered mental status/dementia.  History provided by patient's wife and male family friend.  Patient has been suffering from dementia  for some time.  Over the last 4 days, he has become aggressive with his wife.  Pushing her or grabbing her.  This aggression is more pronounced at nighttime.  She spoke with the patient's primary care provider on Friday who recommended increasing his risperidone  dosing.  This increase in dosing did not change the patient's behavior.  In the past when the patient has been aggressive his wife have been able to coax him back to bed.  She has not been able to do so over the last 4 days and she has been unable to sleep due to his behavior.  Wife had already been in the process of trying to find placement for her husband and herself in a nursing facility.  However, with the patient's recent persistent aggression it has become more prescient to find SNF placement for patient for his safety and his wife's safety.     Patient is not complaining of any pain or concerns at this time.  He believes he is here for evaluation and management of his medications.  He knows he is in [place] however he is unable to answer any other orientation questions correctly.  He is pleasant and nonaggressive currently. Wife denies any recent injuries or falls for the patient.  Wife denies any recent illness, fevers, or medical complaints from the patient.  Patient Report:  Patient states that he does not know why he is  in the hospital. He denies experiencing auditory or visual hallucinations. He denies experiencing paranoia. Objectively, no signs of acute psychosis or evidence of delusional or paranoia thought content on exam. He denies having thoughts of wanting to harm himself or other people. He denies past suicide attempt. He describes his mood as pretty fair and denies agitation, anger, irritability, depression or anxiety. He denies aggression. He reports sleeping 7 hours. He reports an improved appetite over the past couple weeks and states that he had loss weight recently due to a poor appetite. He states that he lives with his  wife and dog. He states that his son passed away. He denies a past psychiatric history. He denies a past inpatient psychiatric hospitalization. He denies alcohol  or drug use.   Psych ROS:  Depression: No Anxiety:  No. However, a documented history of anxiety. Mania (lifetime and current): No Psychosis: (lifetime and current): Patient denies, Wife reports patient is hallucinating   Collateral information:  Contacted Kasper Mudrick on 05/28/24 via telephone. Mrs. Hyde states that last night the patient was looking under the cover for their son who passed away 3 years ago because he thought he heard him talking and stripped the bed looking for him. In addition, she reports that he was also looking for his mother who died in 76. She states that the day before yesterday he went on the porch and onto their patio in the cold because he thought he saw their car in the area and that there's nothing back there but woods and animals.  She states that he was looking for 4 cars and that they only own 2 cars which are in the garage. She states that yesterday he put his hand on her wrist and had a look of the determination which bothered her because he had never done that before. When asked if he has been exhibiting aggressive behaviors, she states to her it's considered aggressive but to others it may not be and describes his behaviors as pushing her out the way to get to where he is trying to go. She states that on 12/8 patient was started on Risperdal  0.25 mg at bedtime but that did not work. She states that on 12/11 Dr. Onita increased the Risperdal  to 0.5 at bedtime and 0.25 daily as needed. She states that the medication has not been working. She also states that the patient had a fall a couple weeks ago and that he tries to use his walker backwards. She states that she has a meeting with [facility] on Tuesday morning to see if she can get the patient into their memory care facility. 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 She states that the patient's doctor, Dr. Onita suggested that the patient has dementia and that the patient has a new patient appointment with neurology on June 29, 2024 with Lauraine Montclair. I discussed stopping the Risperdal  and starting Seroquel  25 mg at bedtime and Seroquel  12.5 twice daily to help with hallucinations/aggression. Mrs. Dedman is agreeable to plan of care.  Review of Systems  Respiratory: Negative.    Cardiovascular: Negative.   Neurological: Negative.      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  Legal Hx: No Living Situation: Resides with wife.  Access to weapons/lethal means: Patient denies    Substance History Alcohol : No Illicit drugs: No Prescription drug abuse: No Rehab hx: No  Exam Findings  Physical Exam:  Vital Signs:  Temp:  [98 F (36.7 C)-98.5 F (36.9 C)] 98.5 F (36.9 C) (12/14 1350) Pulse Rate:  [81-122] 122 (12/14 1543) Resp:  [12-22] 22 (12/14 1543) BP: (130-182)/(73-104) 182/104 (12/14 1543) SpO2:  [99 %-100 %] 99 % (12/14 1543) Blood pressure (!) 182/104, pulse (!) 122, temperature 98.5 F (36.9 C), temperature source Oral, resp. rate (!) 22, SpO2 99%. There is no height or weight on file to calculate BMI.  Physical Exam Cardiovascular:     Rate and Rhythm: Normal rate.  Pulmonary:     Effort: Pulmonary effort is normal.  Neurological:     Mental Status: He is alert. He is disoriented.     Mental Status Exam: General Appearance: Casual  Orientation:   Full (Time, Place, and Person)  Memory:  Immediate;   Poor Recent;   Poor Remote;   Poor  Concentration:  Concentration: Fair  Recall:  Poor  Attention  Fair  Eye Contact:  Fair  Speech:  Slow  Language:  Fair  Volume:  Decreased  Mood: Euthymic   Affect:  Appropriate  Thought Process:  Linear  Thought Content:  Logical  Suicidal Thoughts:  No  Homicidal Thoughts:  No  Judgement:  Impaired  Insight:  Impaired   Psychomotor Activity:  unsteady  Akathisia:  No  Fund of Knowledge:  Poor      Assets:  Financial Resources/Insurance Housing Social Support  Cognition:  Imparied   ADL's:  Per wife, patient requires assistance   AIMS (if indicated):        Other History   These have been pulled in through the EMR, reviewed, and updated if appropriate.  Family History:  The patient's family history includes CVA in his mother; Cancer - Other in his father; Colon cancer in his sister.  Medical History: Past Medical History:  Diagnosis Date   Cataract    Hyperlipidemia    Hypertension    Ischemic optic neuropathy of right eye    anterior   Lumbar herniated disc     Surgical History: Past Surgical History:  Procedure Laterality Date   CATARACT EXTRACTION     11/01/06-left eye    12/17/09-right eye   FINGER SURGERY     kidney stones     lithotripsy done   TONSILLECTOMY       Medications:  Current Medications[1]  Allergies: Allergies[2]  Kie Calvin L, NP     [1]  Current Facility-Administered Medications:    aspirin  EC tablet 325 mg, 325 mg, Oral, Daily, Rosina Almarie LABOR, PA-C   atorvastatin  (LIPITOR) tablet 10 mg, 10 mg, Oral, Daily, Rosina Almarie LABOR, PA-C, 10 mg at 05/28/24 1653   folic acid  (FOLVITE ) tablet 2.5 mg, 2.5 mg, Oral, Daily **AND** pyridOXINE  (VITAMIN B6) tablet 25 mg, 25 mg, Oral, Daily **AND** cyanocobalamin  (VITAMIN B12) tablet 2,000 mcg, 2,000 mcg, Oral, Daily, Young, Travis J, DO   donepezil  (ARICEPT ) tablet 5 mg, 5 mg, Oral,  QHS, Rosina Almarie A, PA-C   mirabegron  ER (MYRBETRIQ ) tablet 25 mg, 25 mg, Oral, Daily, Rosina Almarie A, PA-C   risperiDONE  (RISPERDAL ) tablet 0.25 mg,  0.25 mg, Oral, Daily, Rosina Almarie LABOR, PA-C, 0.25 mg at 05/28/24 1653   tamsulosin  (FLOMAX ) capsule 0.4 mg, 0.4 mg, Oral, Daily, Rosina Almarie LABOR, PA-C  Current Outpatient Medications:    aspirin  325 MG tablet, Take 325 mg by mouth at bedtime., Disp: , Rfl:    atorvastatin  (LIPITOR) 10 MG tablet, Take 10 mg by mouth every evening., Disp: , Rfl:    cyanocobalamin  (VITAMIN B12) 1000 MCG tablet, Take 1,000 mcg by mouth daily., Disp: , Rfl:    gabapentin (NEURONTIN) 300 MG capsule, Take 300 mg by mouth 4 (four) times daily., Disp: , Rfl:    GEMTESA 75 MG TABS, Take 1 tablet by mouth daily., Disp: , Rfl:    Multiple Vitamins-Minerals (PRESERVISION AREDS 2 PO), Take 1 tablet by mouth 2 (two) times daily., Disp: , Rfl:    risperiDONE  (RISPERDAL ) 0.25 MG tablet, Take 0.25-0.5 mg by mouth See admin instructions. Take 2 tablets (0.5mg ) by mouth at night, and 1 tablet (0.25mg ) in the afternoon., Disp: , Rfl:    tamsulosin  (FLOMAX ) 0.4 MG CAPS capsule, Take 0.4 mg by mouth daily., Disp: , Rfl:    donepezil  (ARICEPT ) 5 MG tablet, Take 5 mg by mouth at bedtime. (Patient not taking: Reported on 05/28/2024), Disp: , Rfl:    FOLPLEX 2.2 2.2-25-0.5 MG TABS, Take 1 tablet by mouth daily. (Patient not taking: Reported on 05/28/2024), Disp: , Rfl: 0 [2] No Known Allergies

## 2024-05-28 NOTE — Progress Notes (Signed)
 PT Cancellation Note  Patient Details Name: [name] MRN: [id number] DOB: [birth year]   Cancelled Treatment:    Reason Eval/Treat Not Completed: Other (comment). Noted Psychiatric Consult pending, pt here d/t recent aggressive behavior/AMS. Spouse unable to care for pt safety at home. Pt for possible SNF placement per chart review.  PT eval to follow, as soon as schedule allows. Thank you for this referral.      [facility] 05/28/2024, 3:55 PM

## 2024-05-28 NOTE — ED Notes (Signed)
 Ok to give HS medication early per Avelina ORN.  NP.

## 2024-05-29 MED ORDER — ARTIFICIAL TEARS OPHTHALMIC OINT
TOPICAL_OINTMENT | Freq: Once | OPHTHALMIC | Status: DC
  Filled 2024-05-29: qty 3.5

## 2024-05-29 NOTE — ED Provider Notes (Signed)
 Emergency Medicine Observation Re-evaluation Note  [name] is a 83 y.o. male, seen on rounds today.  Pt initially presented to the ED for complaints of Altered Mental Status Currently, the patient is sleeping.  Physical Exam  BP 127/67   Pulse 85   Temp 97.9 F (36.6 C) (Axillary)   Resp 16   SpO2 99%  Physical Exam General: Sleeping Cardiac: Regular Lungs: Clear Psych: Sleeping but when awake has been agitated and combative  ED Course / MDM  EKG:EKG Interpretation Date/Time:  Sunday May 28 2024 11:18:33 EST Ventricular Rate:  88 PR Interval:  147 QRS Duration:  82 QT Interval:  358 QTC Calculation: 434 R Axis:   59  Text Interpretation: Sinus rhythm Confirmed by Neysa Clap [phone]) on 05/28/2024 12:48:28 PM  I have reviewed the labs performed to date as well as medications administered while in observation.  Recent changes in the last 24 hours include due to agitation last night patient required chemical sedation and physical restraints for short time but is no longer needing those.  Psychiatry evaluated and recommended medication changes to Seroquel  but otherwise felt that he needed outpatient neuro and skilled placement.  Plan  Current plan is for placement.    Doretha Folks, MD 05/29/24 [phone]

## 2024-05-29 NOTE — ED Provider Notes (Signed)
 Attempted to reassess the patient; patients wife and family friend, Omar, were present at bedside. Patient appeared significantly drowsy. Per RN report, the patient received Seroquel  12.5 mg at 2210 and Seroquel  25 mg at [facility] yesterday, as well as Haldol  2 mg IV at 1736 and 0003 for agitation. This provider attempted to awaken the patient; he was noted to be very sedated and unable to meaningfully participate in evaluation.  This provider will discuss the case with the collaborating psychiatrist, Dr. Larina, to consider medication adjustments in the setting of excessive sedation. Patient will be reassessed later today.

## 2024-05-29 NOTE — ED Notes (Signed)
 Mr [name] remains restless attempting to push past staff to get OOB states  I am going home to my bed. Repeatedly attempts to push past staff, and is a high risk for a fall.

## 2024-05-29 NOTE — Progress Notes (Signed)
 PT Cancellation Note  Patient Details Name: [name] MRN: [id number] DOB: December 29, 1940   Cancelled Treatment:    Reason Eval/Treat Not Completed: Fatigue/lethargy limiting ability to participate Per RN, do not disturb patient who is asleep and resting. Will follow if patient is  able to  participate. [name] PT Acute Rehabilitation Services Office [phone]    [name] Norris 05/29/2024, 9:16 AM

## 2024-05-29 NOTE — Progress Notes (Addendum)
 CSW spoke with pt's wife and family friend, Omar. CSW inquired about whether she is able to manage pt at home and wife became emotional stating she is unable to manage him in the home anymore. She reports she has already spoken with [facility] rep and will meet with them tomorrow at 2pm. CSW advised to reach out to see if they can meet sooner. Per wife and family friends, finances will not be an issue to pay for placement.   Wife feels she has more Security at [facility] but is also interested in pt being placed at [facility]. Family friend, Omar, contacted [facility] who reports they may have memory care openings and he is awaiting a call back from the Librarian, Academic.   CSW explained levels of care and informed that SNFs are not typically equipped to manage folks with dementia; especially pt's who sundown and wander due to their not being locked units. Wife verbalized understanding and thanked this clinical research associate for the information. Per wife and Omar, they will await for the pt to wake up so psychiatry can evaluate.   ICM following.  Addend @ 3:22PM Will follow for psych and PT eval.

## 2024-05-30 DIAGNOSIS — F0390 Unspecified dementia without behavioral disturbance: Secondary | ICD-10-CM

## 2024-05-30 LAB — CBG MONITORING, ED
Glucose-Capillary: 67 mg/dL — ABNORMAL LOW (ref 70–99)
Glucose-Capillary: 102 mg/dL — ABNORMAL HIGH (ref 70–99)

## 2024-05-30 MED ORDER — TUBERCULIN PPD 5 UNIT/0.1ML ID SOLN
5.0000 [IU] | INTRADERMAL | Status: AC
  Administered 2024-05-30: 11:00:00 5 [IU] via INTRADERMAL
  Filled 2024-05-30: qty 0.1

## 2024-05-30 NOTE — ED Notes (Signed)
 [facility] received a call from pts wife informing us  that she has secured a placement for her husband at [facility] and will be putting down a deposit today. Pts wife expressed mixed emotions, feeling relieved that her husband has a placement that can provide the higher level of care that he requires but also sad that she will not be in the same facility. Pts wife's placement at [facility] about three minutes away and she can visit when she wants to. [facility] suggested pts wife seek counseling once she has settled into her new home to assist her with working through the significant change that is happening in her life. Pts wife expressed appreciation for all of the help she has received during this transition.    Chesley Holt, [facility]  05/30/24

## 2024-05-30 NOTE — Evaluation (Signed)
 Physical Therapy Evaluation Patient Details Name: [name] MRN: [id number] DOB: 1940-09-09 Today's Date: 05/30/2024  History of Present Illness  83 yo male brought to Ed 12/14 from home  with increased aggressive behavior, confusion. PMH: dementia,HTN, HNP  Clinical Impression  The patient is awake and able to be directed  and participate in mobility.. Patient assisted to sitting with multimodal cues and assistance  then stood at [facility] with support and  ambulated x   70' using a Rw with cues for staying with activity.  Patient will benefit from PT after DC to memory care as per TOC. Patient does  best when not awakened, but for PT to  work with patient when he awakens. PT will follow while in acute care.      If plan is discharge home, recommend the following: A little help with walking and/or transfers;A little help with bathing/dressing/bathroom   Can travel by private vehicle        Equipment Recommendations None recommended by PT  Recommendations for Other Services       Functional Status Assessment Patient has had a recent decline in their functional status and/or demonstrates limited ability to make significant improvements in function in a reasonable and predictable amount of time     Precautions / Restrictions Precautions Precautions: Fall Restrictions Weight Bearing Restrictions Per Provider Order: No      Mobility  Bed Mobility Overal bed mobility: Needs Assistance Bed Mobility: Supine to Sit, Sit to Supine     Supine to sit: Mod assist Sit to supine: Mod assist   General bed mobility comments: multimodal cues to  move legs to bed edge, assist with legs, pulls to sit upright with UE's. assisted legs back onto bed, mostly due to patient not FC to return to  bed.    Transfers Overall transfer level: Needs assistance Equipment used: Rolling walker (2 wheels) Transfers: Sit to/from Stand Sit to Stand: Mod assist           General transfer comment:  steady support to rise form bed.    Ambulation/Gait Ambulation/Gait assistance: Min assist Gait Distance (Feet): 70 Feet Assistive device: Rolling walker (2 wheels) Gait Pattern/deviations: Step-to pattern, Step-through pattern, Shuffle Gait velocity: decr     General Gait Details: asssited with forward progression of RW, patient required min support for balance and turns.  Stairs            Wheelchair Mobility     Tilt Bed    Modified Rankin (Stroke Patients Only)       Balance Overall balance assessment: Needs assistance Sitting-balance support: Feet supported, No upper extremity supported Sitting balance-Leahy Scale: Fair     Standing balance support: Reliant on assistive device for balance, During functional activity, Bilateral upper extremity supported Standing balance-Leahy Scale: Poor                               Pertinent Vitals/Pain Pain Assessment Breathing: normal Negative Vocalization: none Facial Expression: smiling or inexpressive Body Language: relaxed Consolability: no need to console PAINAD Score: 0    Home Living Family/patient expects to be discharged to:: Private residence Living Arrangements: Spouse/significant other                 Additional Comments: no family present, was ambulatory as he wandered    Prior Function               Mobility Comments: unsure  of ADL assistance PTA, was ambuklatory? with AD? no family presnet       Extremity/Trunk Assessment   Upper Extremity Assessment Upper Extremity Assessment: Generalized weakness    Lower Extremity Assessment Lower Extremity Assessment: Generalized weakness    Cervical / Trunk Assessment Cervical / Trunk Assessment: Kyphotic  Communication   Communication Communication: Impaired Factors Affecting Communication: Difficulty expressing self    Cognition Arousal: Alert Behavior During Therapy: WFL for tasks assessed/performed, Flat affect    PT - Cognitive impairments: History of cognitive impairments                       PT - Cognition Comments: patient able to be directed with cues to mobilize, able to participate         Cueing Cueing Techniques: Verbal cues, Gestural cues, Tactile cues     General Comments      Exercises     Assessment/Plan    PT Assessment Patient needs continued PT services  PT Problem List Decreased strength;Decreased cognition;Decreased activity tolerance;Decreased balance;Decreased mobility;Decreased knowledge of precautions       PT Treatment Interventions DME instruction;Gait training;Functional mobility training;Therapeutic activities;Patient/family education;Cognitive remediation    PT Goals (Current goals can be found in the Care Plan section)  Acute Rehab PT Goals PT Goal Formulation: Patient unable to participate in goal setting Time For Goal Achievement: 06/13/24 Potential to Achieve Goals: Fair    Frequency Min 2X/week     Co-evaluation               AM-PAC PT 6 Clicks Mobility  Outcome Measure Help needed turning from your back to your side while in a flat bed without using bedrails?: A Little Help needed moving from lying on your back to sitting on the side of a flat bed without using bedrails?: A Little Help needed moving to and from a bed to a chair (including a wheelchair)?: A Lot Help needed standing up from a chair using your arms (e.g., wheelchair or bedside chair)?: A Lot Help needed to walk in hospital room?: A Lot Help needed climbing 3-5 steps with a railing? : Total 6 Click Score: 13    End of Session Equipment Utilized During Treatment: Gait belt Activity Tolerance: Patient tolerated treatment well Patient left: in bed;with nursing/sitter in room Nurse Communication: Mobility status PT Visit Diagnosis: Unsteadiness on feet (R26.81);Other abnormalities of gait and mobility (R26.89);Repeated falls (R29.6);Muscle weakness (generalized)  (M62.81)    Time: 1050-1110 PT Time Calculation (min) (ACUTE ONLY): 20 min   Charges:   PT Evaluation $PT Eval Low Complexity: 1 Low   PT General Charges $$ ACUTE PT VISIT: 1 Visit         [name] PT Acute Rehabilitation Services Office [phone]   [name] Norris 05/30/2024, 11:20 AM

## 2024-05-30 NOTE — ED Provider Notes (Signed)
 Emergency Medicine Observation Re-evaluation Note  [name] is a 83 y.o. male, seen on rounds today.  Pt initially presented to the ED for complaints of Altered Mental Status Currently, the patient is sleeping.  Physical Exam  BP (!) 153/85 (BP Location: Left Arm)   Pulse 100   Temp (!) 97.4 F (36.3 C) (Oral)   Resp 20   SpO2 98%  Physical Exam General: No acute distress Cardiac: Regular Lungs: Clear Psych: Intermittently agitated  ED Course / MDM  EKG:EKG Interpretation Date/Time:  Sunday May 28 2024 11:18:33 EST Ventricular Rate:  88 PR Interval:  147 QRS Duration:  82 QT Interval:  358 QTC Calculation: 434 R Axis:   59  Text Interpretation: Sinus rhythm Confirmed by Neysa Clap [phone]) on 05/28/2024 12:48:28 PM  I have reviewed the labs performed to date as well as medications administered while in observation.  Recent changes in the last 24 hours include attempted evaluation by psychiatry yesterday and patient was significantly sedated due to multiple as needed medications for agitation the day before.  Plan  Current plan is for adjusting behavioral medications so as not to cause such sedation.  Wife and family member are looking for skilled facilities, [facility] working with family.    Doretha Folks, MD 05/30/24 [phone]

## 2024-05-30 NOTE — Progress Notes (Signed)
 CSW spoke with pt to inquire about meeting with [facility] sooner. She reports she expects a call from [facility] to discuss whether they can accept the pt; however, reports she has spoken with  Grayce at [facility] who has an opening in their memory care unit. She reports planning to meet with them this afternoon to make a deposit.  CSW advised that we would need to move forward with who has an immediate opening. Mrs. Kaczmarczyk verbalized understanding noting my reality is checking in. She provided this clinical research associate with Robin's name who she thinks runs the place. CSW attempted to contact Grayce and was informed she is in morning meeting until 10AM. CSW left HIPAA Compliant voicemail requesting call back to inquire about paperwork needed.   CSW requested EDP order Skin test. Will start FL2.

## 2024-05-30 NOTE — Progress Notes (Addendum)
 CSW received callback from Robin at [facility]. Grayce reported that facility is unable to accept a chest X-ray in place of required PPD; PPD must be placed in forearm for admission. CSW sent clinical documentation, including FL2, to Grayce for review. Grayce stated that nursing staff will come to the hospital this afternoon to assess pt. Pts wife is also scheduled to visit the facility for a tour, complete admissions paperwork, and provide room deposit. Admission date remains tentative and is dependent upon completion of paperwork, payment submission, room setup, and outcome of nursing assessment. Grayce reported that the facility does not do weekend admissions but will work with pts wife to facilitate admission as soon as possible once requirements are met and facility confirms ability to meet pts care needs. Awaiting nursing assessment and completion of admissions requirements. CSW to follow.  4:45pm CSW received call from Robin requesting that pt primary diagnosis be switched to unspecified dementia and not Major neurocognitive disorder until pt has his appt w/ neurologist in January. MD notified. CSW will update FL2 once completed.

## 2024-05-30 NOTE — NC FL2 (Signed)
 [place]  MEDICAID [id number] LEVEL OF CARE FORM     IDENTIFICATION  Patient Name: [name] Birthdate: January 28, 1941 Sex: male Admission Date (Current Location): 05/28/2024  [facility] and [place] Number:  Producer, Television/film/video and Address:  [facility],  [address] [place], [place] [address]      Provider Number: [id number]  Attending Physician Name and Address:  Doretha Folks, MD  Relative Name and Phone Number:  Zohar, Maroney, Emergency Contact  [phone] [facility])    Current Level of Care: Hospital Recommended Level of Care: Memory Care Prior Approval Number:    Date Approved/Denied:   PASRR Number:    Discharge Plan: Other (Comment) (Memory Care)    Current Diagnoses: Patient Active Problem List   Diagnosis Date Noted   Major neurocognitive disorder (HCC) 05/28/2024   Numbness 08/22/2019   Cervical spinal stenosis 08/22/2019   Cervical facet joint syndrome 08/22/2019   Neck pain 08/22/2019   Retinal artery occlusion 08/22/2019   Chest pain 09/18/2016    Orientation RESPIRATION BLADDER Height & Weight     Self  Normal Incontinent Weight:   Height:     BEHAVIORAL SYMPTOMS/MOOD NEUROLOGICAL BOWEL NUTRITION STATUS      Incontinent Diet (Regular)  AMBULATORY STATUS COMMUNICATION OF NEEDS Skin   Limited Assist Verbally Normal                       Personal Care Assistance Level of Assistance  Bathing, Feeding, Dressing Bathing Assistance: Limited assistance Feeding assistance: Independent Dressing Assistance: Limited assistance     Functional Limitations Info  Sight, Hearing, Speech Sight Info: Adequate Hearing Info: Adequate Speech Info: Adequate    SPECIAL CARE FACTORS FREQUENCY                       Contractures Contractures Info: Not present    Additional Factors Info  Code Status, Allergies Code Status Info: Full Allergies Info: No Known Allergies           Current Medications (05/30/2024):  This is the  current hospital active medication list Current Facility-Administered Medications  Medication Dose Route Frequency Provider Last Rate Last Admin   artificial tears (LACRILUBE) ophthalmic ointment   Both Eyes Once Doretha Folks, MD       aspirin  EC tablet 325 mg  325 mg Oral Daily Rosina Norris A, PA-C   325 mg at 05/30/24 1019   atorvastatin  (LIPITOR) tablet 10 mg  10 mg Oral Daily Rosina Norris A, PA-C   10 mg at 05/30/24 1021   folic acid  (FOLVITE ) tablet 2.5 mg  2.5 mg Oral Daily Young, Travis J, DO   2.5 mg at 05/30/24 1022   And   pyridOXINE  (VITAMIN B6) tablet 25 mg  25 mg Oral Daily Young, Travis J, DO   25 mg at 05/30/24 1020   And   cyanocobalamin  (VITAMIN B12) tablet 2,000 mcg  2,000 mcg Oral Daily Young, Travis J, DO   2,000 mcg at 05/30/24 1022   donepezil  (ARICEPT ) tablet 5 mg  5 mg Oral QHS Rosina Norris A, PA-C   5 mg at 05/29/24 2251   mirabegron  ER (MYRBETRIQ ) tablet 25 mg  25 mg Oral Daily Rosina Norris A, PA-C   25 mg at 05/30/24 1019   QUEtiapine  (SEROQUEL ) tablet 12.5 mg  12.5 mg Oral Daily PRN White, Patrice L, NP   12.5 mg at 05/28/24 2210   QUEtiapine  (SEROQUEL ) tablet 25 mg  25  mg Oral QHS White, Patrice L, NP   25 mg at 05/29/24 2251   tamsulosin  (FLOMAX ) capsule 0.4 mg  0.4 mg Oral Daily Rosina Norris A, PA-C   0.4 mg at 05/30/24 1019   tuberculin injection 5 Units  5 Units Intradermal STAT Doretha Folks, MD   5 Units at 05/30/24 1058   Current Outpatient Medications  Medication Sig Dispense Refill   aspirin  325 MG tablet Take 325 mg by mouth at bedtime.     atorvastatin  (LIPITOR) 10 MG tablet Take 10 mg by mouth every evening.     cyanocobalamin  (VITAMIN B12) 1000 MCG tablet Take 1,000 mcg by mouth daily.     gabapentin (NEURONTIN) 300 MG capsule Take 300 mg by mouth 4 (four) times daily.     GEMTESA 75 MG TABS Take 1 tablet by mouth daily.     Multiple Vitamins-Minerals (PRESERVISION AREDS 2 PO) Take 1 tablet by mouth 2 (two) times  daily.     risperiDONE  (RISPERDAL ) 0.25 MG tablet Take 0.25-0.5 mg by mouth See admin instructions. Take 2 tablets (0.5mg ) by mouth at night, and 1 tablet (0.25mg ) in the afternoon.     tamsulosin  (FLOMAX ) 0.4 MG CAPS capsule Take 0.4 mg by mouth daily.     donepezil  (ARICEPT ) 5 MG tablet Take 5 mg by mouth at bedtime. (Patient not taking: Reported on 05/28/2024)     FOLPLEX 2.2 2.2-25-0.5 MG TABS Take 1 tablet by mouth daily. (Patient not taking: Reported on 05/28/2024)  0     Discharge Medications: Please see discharge summary for a list of discharge medications.  Relevant Imaging Results:  Relevant Lab Results:   Additional Information SSN:[id number]  Sheri ONEIDA Sharps, LCSW

## 2024-05-30 NOTE — Consult Note (Signed)
 [facility] Psychiatric Consult Initial  Patient Name: .[name]  MRN: [id number]  DOB: May 15, 1941  Consult Order details:  Orders (From admission, onward)     Start     Ordered   05/28/24 1718  CONSULT TO CALL ACT TEAM       Ordering Provider: Rosina Almarie LABOR, PA-C  Provider:  (Not yet assigned)  Question:  Reason for Consult?  Answer:  Medication management   05/28/24 1717   05/28/24 1435  IP CONSULT TO PSYCHIATRY       Ordering Provider: Rosina Almarie LABOR, PA-C  Provider:  (Not yet assigned)  Question:  Reason for consult:  Answer:  Medication management   05/28/24 1434             Mode of Visit: In person    Psychiatry Consult Evaluation  Service Date: May 30, 2024 LOS:  LOS: 0 days  Chief Complaint   Primary Psychiatric Diagnoses  Major neurocognitive disorder - dementia    Assessment  [name] is a 83 y.o. male admitted: Presented to the ED on 05/28/2024  9:42 AM for worsening aggression and hallucinations. He carries the psychiatric diagnoses of anxiety and has a past medical history of dementia unspecified, hypertension, and hyperlipidemia.     The patients presentation is consistent with progressive dementia with associated confusion. There is no evidence of an acute primary psychiatric disorder warranting inpatient psychiatric admission. His prior sedation has improved following medication adjustment. He does not appear to be in pain, distress, or experiencing behavioral agitation requiring inpatient psychiatric intervention. Please see plan below for detailed recommendations.   Diagnoses:  Active Hospital problems: Principal Problem:   Major neurocognitive disorder [facility])    Plan   ## Psychiatric Medication Recommendations:  Plan / Recommendations  Psychiatric clearance provided -- patient does not meet criteria for inpatient psychiatric hospitalization.  Continue Seroquel  25 mg PO at bedtime for mood/behavioral  symptoms.  Continue Seroquel  12.5 mg PO daily PRN for agitation.  Discontinue Seroquel  12.5 mg PO BID due to sedation.  Continue sitter and fall precautions as medically indicated.  Defer ongoing management of dementia-related behaviors to medical team and receiving facility.  ## Medical Decision Making Capacity: Not specifically addressed in this encounter  ## Further Work-up:  UDS -- most recent EKG on 05/28/24 had QtC of 434 -- Pertinent labwork reviewed earlier this admission includes:    ## Disposition:-- Behavioral Murillo Coordinator spoke with the patients wife this morning. She reports that the patient will be transitioning to [place] in [place], and she is placing a deposit today. Social work will assist with facilitating the transition to [employer]. Patient is psychiatrically stable and cleared for discharge/placement once medically appropriate.   ## Behavioral / Environmental: -Delirium Precautions: Delirium Interventions for Nursing and Staff: - RN to open blinds every AM. - To Bedside: Glasses, hearing aide, and pt's own shoes. Make available to patients. when possible and encourage use. - Encourage po fluids when appropriate, keep fluids within reach. - OOB to chair with meals. - Passive ROM exercises to all extremities with AM & PM care. - RN to assess orientation to person, time and place QAM and PRN. - Recommend extended visitation hours with familiar family/friends as feasible. - Staff to minimize disturbances at night. Turn off television when pt asleep or when not in use.    ## Safety and Observation Level:  - Based on my clinical evaluation, I estimate the patient to be at low risk of self  harm in the current setting. - At this time, we recommend  1:1 Observation. This decision is based on my review of the chart including patient's history and current presentation, interview of the patient, mental status examination, and consideration of suicide risk including  evaluating suicidal ideation, plan, intent, suicidal or self-harm behaviors, risk factors, and protective factors. This judgment is based on our ability to directly address suicide risk, implement suicide prevention strategies, and develop a safety plan while the patient is in the clinical setting. Please contact our team if there is a concern that risk level has changed.  CSSR Risk Category:C-SSRS RISK CATEGORY: No Risk  Suicide Risk Assessment: Patient has following modifiable risk factors for suicide: impulsive behaviors which we are addressing by adjusting medications and recommending outpatient neurology. Patient has following non-modifiable or demographic risk factors for suicide: male gender Patient has the following protective factors against suicide: Supportive family, no history of suicide attempts, and no history of NSSIB  Thank you for this consult request. Recommendations have been communicated to the primary team.  We will continue to follow to manage medication adjustments at this time.   CATHALEEN ADAM, PMHNP       History of Present Illness  Relevant Aspects of Hospital ED Course: Admitted on 05/28/2024 for worsening aggression and hallucinations.   Per Rosina Norris, PA., note: Patient is here for worsening altered mental status/dementia.  History provided by patient's wife and male family friend.  Patient has been suffering from dementia for some time.  Over the last 4 days, he has become aggressive with his wife.  Pushing her or grabbing her.  This aggression is more pronounced at nighttime.  She spoke with the patient's primary care provider on Friday who recommended increasing his risperidone  dosing.  This increase in dosing did not change the patient's behavior.  In the past when the patient has been aggressive his wife have been able to coax him back to bed.  She has not been able to do so over the last 4 days and she has been unable to sleep due to his behavior.   Wife had already been in the process of trying to find placement for her husband and herself in a nursing facility.  However, with the patient's recent persistent aggression it has become more prescient to find SNF placement for patient for his safety and his wife's safety.      Patient Report:  The patient is an 83 year old male who presented to the emergency department on 05/28/2024 due to confusion and worsening altered mental status over the past four days, consistent with progression of dementia.  On 05/28/2024, the patient was initiated on quetiapine  (Seroquel ) for mood stabilization and agitation, including:  Seroquel  12.5 mg PO BID  Seroquel  25 mg PO at bedtime  Seroquel  12.5 mg PO daily PRN for agitation  When this provider evaluated the patient yesterday, he was significantly drowsy, unable to open his eyes consistently, and unable to meaningfully participate in the psychiatric evaluation.  On todays assessment, the patient appeared more alert and was able to accept breakfast with assistance from his sitter. He has been tolerating medications administered in applesauce. Given prior excessive sedation, Seroquel  12.5 mg PO BID has been discontinued. The patient will continue Seroquel  25 mg PO at bedtime and Seroquel  12.5 mg PO daily PRN for agitation.   Psych ROS:  Depression: No Anxiety:  No. However, a documented history of anxiety. Mania (lifetime and current): No Psychosis: (lifetime and current): Patient denies,  Wife reports patient is hallucinating   Collateral information:  See behavioral Murillo coordinator note    Review of Systems  Respiratory: Negative.    Cardiovascular: Negative.   Neurological: Negative.      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  Legal Hx: No Living Situation: Resides with wife.  Access to weapons/lethal means: Patient denies    Substance History Alcohol : No Illicit drugs: No Prescription drug abuse: No Rehab hx: No  Exam Findings  Physical Exam:  Vital Signs:  Temp:  [97.4 F (36.3 C)-98.4 F (36.9 C)] 97.4 F (36.3 C) (12/16 0642) Pulse Rate:  [100-112] 100 (12/16 0642) Resp:  [15-20] 20 (12/16 0642) BP: (151-163)/(85-94) 153/85 (12/16 0642) SpO2:  [97 %-98 %] 98 % (12/16 0642) Blood pressure (!) 153/85, pulse 100, temperature (!) 97.4 F (36.3 C), temperature source Oral, resp. rate 20, SpO2 98%. There is no height or weight on file to calculate BMI.  Physical Exam Cardiovascular:     Rate and Rhythm: Normal rate.  Pulmonary:     Effort: Pulmonary effort is normal.  Neurological:     Mental Status: He is alert. He is disoriented.     Mental Status Exam: General Appearance: Casual Calm, cooperative, pleasantly confused  Orientation:  Alert and Oriented to person only; disoriented to place, time, and situation, pleasantly confused  Memory:  Immediate;   Poor Recent;   Poor Remote;   Poor  Concentration:  Concentration: Fair  Recall:  Poor  Attention  Fair  Eye Contact:  Fair  Speech:  Slow Coherent, decreased tone  Language:  Fair  Volume:  Decreased  Mood: Euthymic   Affect:  Appropriate  Thought Process: Impaired secondary to dementia  Thought Content:  No evidence of delusions or hallucinations  Suicidal Thoughts:  No  Homicidal Thoughts:  No  Judgement:  Impaired  Insight:  Impaired   Psychomotor Activity:  unsteady  Akathisia:  No  Fund of Knowledge:  Poor    Assets:  Murillo And Safety Inspector Housing Social Support  Cognition: Impaired secondary to dementia  ADL's:  Per wife, patient requires  assistance   AIMS (if indicated):        Other History   These have been pulled in through the EMR, reviewed, and updated if appropriate.  Family History:  The patient's family history includes CVA in his mother; Cancer - Other in his father; Colon cancer in his sister.  Medical History: Past Medical History:  Diagnosis Date   Cataract    Hyperlipidemia    Hypertension    Ischemic optic neuropathy of right eye    anterior   Lumbar herniated disc     Surgical History: Past Surgical History:  Procedure Laterality Date   CATARACT EXTRACTION     11/01/06-left eye    12/17/09-right eye   FINGER SURGERY     kidney stones     lithotripsy done   TONSILLECTOMY       Medications:  Current Medications[1]  Allergies: Allergies[2]  Shanesha Bednarz MOTLEY-MANGRUM, PMHNP      [1]  Current Facility-Administered Medications:    artificial tears (LACRILUBE) ophthalmic ointment, , Both Eyes,  Once, Doretha Folks, MD   aspirin  EC tablet 325 mg, 325 mg, Oral, Daily, Rosina Almarie LABOR, PA-C, 325 mg at 05/30/24 1019   atorvastatin  (LIPITOR) tablet 10 mg, 10 mg, Oral, Daily, Rosina Almarie LABOR, PA-C, 10 mg at 05/30/24 1021   folic acid  (FOLVITE ) tablet 2.5 mg, 2.5 mg, Oral, Daily, 2.5 mg at 05/30/24 1022 **AND** pyridOXINE  (VITAMIN B6) tablet 25 mg, 25 mg, Oral, Daily, 25 mg at 05/30/24 1020 **AND** cyanocobalamin  (VITAMIN B12) tablet 2,000 mcg, 2,000 mcg, Oral, Daily, Young, Travis J, DO, 2,000 mcg at 05/30/24 1022   donepezil  (ARICEPT ) tablet 5 mg, 5 mg, Oral, QHS, Rosina Almarie LABOR, PA-C, 5 mg at 05/29/24 2251   mirabegron  ER (MYRBETRIQ ) tablet 25 mg, 25 mg, Oral, Daily, Rosina Almarie LABOR, PA-C, 25 mg at 05/30/24 1019   QUEtiapine  (SEROQUEL ) tablet 12.5 mg, 12.5 mg, Oral, Daily PRN, White, Patrice L, NP, 12.5 mg at 05/28/24 2210   QUEtiapine  (SEROQUEL ) tablet 25 mg, 25 mg, Oral, QHS, White, Patrice L, NP, 25 mg at 05/29/24 2251   tamsulosin  (FLOMAX ) capsule 0.4 mg, 0.4 mg, Oral,  Daily, Rosina Almarie A, PA-C, 0.4 mg at 05/30/24 1019   tuberculin injection 5 Units, 5 Units, Intradermal, STAT, Plunkett, Whitney, MD, 5 Units at 05/30/24 1058  Current Outpatient Medications:    aspirin  325 MG tablet, Take 325 mg by mouth at bedtime., Disp: , Rfl:    atorvastatin  (LIPITOR) 10 MG tablet, Take 10 mg by mouth every evening., Disp: , Rfl:    cyanocobalamin  (VITAMIN B12) 1000 MCG tablet, Take 1,000 mcg by mouth daily., Disp: , Rfl:    gabapentin (NEURONTIN) 300 MG capsule, Take 300 mg by mouth 4 (four) times daily., Disp: , Rfl:    GEMTESA 75 MG TABS, Take 1 tablet by mouth daily., Disp: , Rfl:    Multiple Vitamins-Minerals (PRESERVISION AREDS 2 PO), Take 1 tablet by mouth 2 (two) times daily., Disp: , Rfl:    risperiDONE  (RISPERDAL ) 0.25 MG tablet, Take 0.25-0.5 mg by mouth See admin instructions. Take 2 tablets (0.5mg ) by mouth at night, and 1 tablet (0.25mg ) in the afternoon., Disp: , Rfl:    tamsulosin  (FLOMAX ) 0.4 MG CAPS capsule, Take 0.4 mg by mouth daily., Disp: , Rfl:    donepezil  (ARICEPT ) 5 MG tablet, Take 5 mg by mouth at bedtime. (Patient not taking: Reported on 05/28/2024), Disp: , Rfl:    FOLPLEX 2.2 2.2-25-0.5 MG TABS, Take 1 tablet by mouth daily. (Patient not taking: Reported on 05/28/2024), Disp: , Rfl: 0 [2] No Known Allergies

## 2024-05-31 NOTE — ED Provider Notes (Signed)
 Emergency Medicine Observation Re-evaluation Note  [name] is a 83 y.o. male, seen on rounds today.  Pt initially presented to the ED for complaints of Altered Mental Status Currently, the patient is normally interacting with staff and eating lunch.  Physical Exam  BP 138/87 (BP Location: Left Arm)   Pulse (!) 105   Temp 98.9 F (37.2 C) (Oral)   Resp 18   SpO2 97%  Physical Exam General: Alert no distress Cardiac: Regular Lungs: Respirations nonlabored Psych: Calm and directable  ED Course / MDM  EKG:EKG Interpretation Date/Time:  Sunday May 28 2024 11:18:33 EST Ventricular Rate:  88 PR Interval:  147 QRS Duration:  82 QT Interval:  358 QTC Calculation: 434 R Axis:   59  Text Interpretation: Sinus rhythm Confirmed by Neysa Clap [phone]) on 05/28/2024 12:48:28 PM  I have reviewed the labs performed to date as well as medications administered while in observation.  Recent changes in the last 24 hours include none.  Plan  Current plan is for return to memory care facility with PT and OT    Armenta Canning, MD 05/31/24 1348

## 2024-05-31 NOTE — NC FL2 (Addendum)
 [place]  MEDICAID [id number] LEVEL OF CARE FORM     IDENTIFICATION  Patient Name: [name] Birthdate: 11-17-1940 Sex: male Admission Date (Current Location): 05/28/2024  [facility] and [place] Number:  Producer, Television/film/video and Address:  [facility],  [address] [place], [place] [address]      Provider Number: [id number]  Attending Physician Name and Address:  Dasie Faden, MD  Relative Name and Phone Number:  Brandan, Robicheaux, Emergency Contact  [phone] [facility])    Current Level of Care: Hospital Recommended Level of Care: Memory Care Prior Approval Number:    Date Approved/Denied:   PASRR Number:    Discharge Plan: Other (Comment) (Memory Care)    Current Diagnoses: Patient Active Problem List   Diagnosis Date Noted   Dementia (HCC) 05/28/2024   Numbness 08/22/2019   Cervical spinal stenosis 08/22/2019   Cervical facet joint syndrome 08/22/2019   Neck pain 08/22/2019   Retinal artery occlusion 08/22/2019   Chest pain 09/18/2016    Orientation RESPIRATION BLADDER Height & Weight     Self  Normal Incontinent Weight:   Height:     BEHAVIORAL SYMPTOMS/MOOD NEUROLOGICAL BOWEL NUTRITION STATUS      Incontinent Diet (Regular)  AMBULATORY STATUS COMMUNICATION OF NEEDS Skin   Limited Assist Verbally Normal                       Personal Care Assistance Level of Assistance  Bathing, Feeding, Dressing Bathing Assistance: Limited assistance Feeding assistance: Independent Dressing Assistance: Limited assistance     Functional Limitations Info  Sight, Hearing, Speech Sight Info: Adequate Hearing Info: Adequate Speech Info: Adequate    SPECIAL CARE FACTORS FREQUENCY                       Contractures Contractures Info: Not present    Additional Factors Info  Code Status, Allergies Code Status Info: Full Allergies Info: No Known Allergies           Current Medications (05/31/2024):  This is the current hospital active  medication list Current Facility-Administered Medications  Medication Dose Route Frequency Provider Last Rate Last Admin   artificial tears (LACRILUBE) ophthalmic ointment   Both Eyes Once Doretha Folks, MD       aspirin  EC tablet 325 mg  325 mg Oral Daily Rosina Norris A, PA-C   325 mg at 05/30/24 1019   atorvastatin  (LIPITOR) tablet 10 mg  10 mg Oral Daily Rosina Norris A, PA-C   10 mg at 05/30/24 1021   folic acid  (FOLVITE ) tablet 2.5 mg  2.5 mg Oral Daily Neysa Caron JINNY, DO   2.5 mg at 05/30/24 1022   And   pyridOXINE  (VITAMIN B6) tablet 25 mg  25 mg Oral Daily Young, Travis J, DO   25 mg at 05/30/24 1020   And   cyanocobalamin  (VITAMIN B12) tablet 2,000 mcg  2,000 mcg Oral Daily Young, Travis J, DO   2,000 mcg at 05/30/24 1022   donepezil  (ARICEPT ) tablet 5 mg  5 mg Oral QHS Rosina Norris A, PA-C   5 mg at 05/30/24 2126   mirabegron  ER (MYRBETRIQ ) tablet 25 mg  25 mg Oral Daily Rosina Norris A, PA-C   25 mg at 05/30/24 1019   QUEtiapine  (SEROQUEL ) tablet 12.5 mg  12.5 mg Oral Daily PRN White, Patrice L, NP   12.5 mg at 05/28/24 2210   QUEtiapine  (SEROQUEL ) tablet 25 mg  25 mg Oral  QHS White, Patrice L, NP   25 mg at 05/30/24 2126   tamsulosin  (FLOMAX ) capsule 0.4 mg  0.4 mg Oral Daily Rosina Norris A, PA-C   0.4 mg at 05/30/24 1019   tuberculin injection 5 Units  5 Units Intradermal STAT Doretha Folks, MD   5 Units at 05/30/24 1058   Current Outpatient Medications  Medication Sig Dispense Refill   aspirin  325 MG tablet Take 325 mg by mouth at bedtime.     atorvastatin  (LIPITOR) 10 MG tablet Take 10 mg by mouth every evening.     cyanocobalamin  (VITAMIN B12) 1000 MCG tablet Take 1,000 mcg by mouth daily.     gabapentin (NEURONTIN) 300 MG capsule Take 300 mg by mouth 4 (four) times daily.     GEMTESA 75 MG TABS Take 1 tablet by mouth daily.     Multiple Vitamins-Minerals (PRESERVISION AREDS 2 PO) Take 1 tablet by mouth 2 (two) times daily.     risperiDONE   (RISPERDAL ) 0.25 MG tablet Take 0.25-0.5 mg by mouth See admin instructions. Take 2 tablets (0.5mg ) by mouth at night, and 1 tablet (0.25mg ) in the afternoon.     tamsulosin  (FLOMAX ) 0.4 MG CAPS capsule Take 0.4 mg by mouth daily.     donepezil  (ARICEPT ) 5 MG tablet Take 5 mg by mouth at bedtime. (Patient not taking: Reported on 05/28/2024)     FOLPLEX 2.2 2.2-25-0.5 MG TABS Take 1 tablet by mouth daily. (Patient not taking: Reported on 05/28/2024)  0     Discharge Medications: Please see discharge summary for a list of discharge medications.  Relevant Imaging Results:  Relevant Lab Results:   Additional Information SSN:[id number]  Kari JONETTA Daisy, LCSW

## 2024-05-31 NOTE — Progress Notes (Addendum)
 FL2 has been updated. CSW sent via secure email to Robin: [email or web address].   Addend @ 10:34AM CSW received call from Adele at [facility]. She is requesting FL2 be cosigned. Plan is for them to pick up the pt on Friday at 9:30AM.  Wife notified. FL2 sent via secure email to: [email or web address]  Addend @ 12:58PM Per chart review, PT rec HHPT services. CSW requested EDP [facility] placed orders for PT/OT. Awaiting orders.   DON at [facility] are requesting AVS and TB Skin Test results on tomorrow. Will provide once results have been read.

## 2024-06-01 NOTE — ED Notes (Signed)
 Pt up, walking around and agitated. Pt was given PRN medication.

## 2024-06-01 NOTE — Progress Notes (Signed)
 CSW emailed AVS and TB test result.

## 2024-06-01 NOTE — Progress Notes (Signed)
 Physical Therapy  Pt was sleeping when Therapy arrived at 11:40.  I did not disturb as he is scheduled to D/C tomorrow morning to ALF.    Katheryn Leap  PTA Acute  Rehabilitation Services Office M-F          [phone]

## 2024-06-01 NOTE — ED Notes (Signed)
 Medications delayed due to pt sleeping

## 2024-06-01 NOTE — ED Provider Notes (Signed)
 Emergency Medicine Observation Re-evaluation Note  [name] is a 82 y.o. male, seen on rounds today.  Pt initially presented to the ED for complaints of Altered Mental Status Currently, the patient is sleeping.  Physical Exam  BP (!) 153/99 (BP Location: Left Arm)   Pulse 89   Temp 97.9 F (36.6 C) (Oral)   Resp 18   SpO2 99%  Physical Exam General: nad Cardiac: regular Lungs: clear Psych: more cooperative  ED Course / MDM  EKG:EKG Interpretation Date/Time:  Sunday May 28 2024 11:18:33 EST Ventricular Rate:  88 PR Interval:  147 QRS Duration:  82 QT Interval:  358 QTC Calculation: 434 R Axis:   59  Text Interpretation: Sinus rhythm Confirmed by Neysa Clap [phone]) on 05/28/2024 12:48:28 PM  I have reviewed the labs performed to date as well as medications administered while in observation.  Recent changes in the last 24 hours include pt has been accepted at [facility] .  Plan  Current plan is for going to [facility] on Friday at 9:30am.    Doretha Folks, MD 06/01/24 [phone]

## 2024-06-01 NOTE — ED Notes (Signed)
 06/01/2024 1058   TB test results, placement right arm; results negative.

## 2024-06-02 NOTE — [place] FL2 (Addendum)
 " [place]  MEDICAID [id number] LEVEL OF CARE FORM     IDENTIFICATION  Patient Name: [name] Birthdate: [birth year] Sex: male Admission Date (Current Location): 05/28/2024  [facility] and [place] Number:  Producer, Television/film/video and Address:  [facility],  [address] [place], [place] [address]      Provider Number: [id number]  Attending Physician Name and Address:  Dasie Faden, MD  Relative Name and Phone Number:  Shermon, Bozzi, Emergency Contact  [phone] [facility])    Current Level of Care: Hospital Recommended Level of Care: Memory Care Prior Approval Number:    Date Approved/Denied:   PASRR Number:    Discharge Plan: Other (Comment) (Memory Care)    Current Diagnoses: Patient Active Problem List   Diagnosis Date Noted   Dementia (HCC) 05/28/2024   Numbness 08/22/2019   Cervical spinal stenosis 08/22/2019   Cervical facet joint syndrome 08/22/2019   Neck pain 08/22/2019   Retinal artery occlusion 08/22/2019   Chest pain 09/18/2016    Orientation RESPIRATION BLADDER Height & Weight     Self  Normal Incontinent Weight:   Height:     BEHAVIORAL SYMPTOMS/MOOD NEUROLOGICAL BOWEL NUTRITION STATUS      Incontinent Diet (Regular)  AMBULATORY STATUS COMMUNICATION OF NEEDS Skin   Limited Assist Verbally Normal                       Personal Care Assistance Level of Assistance  Bathing, Feeding, Dressing Bathing Assistance: Limited assistance Feeding assistance: Independent Dressing Assistance: Limited assistance     Functional Limitations Info  Sight, Hearing, Speech Sight Info: Adequate Hearing Info: Adequate Speech Info: Adequate    SPECIAL CARE FACTORS FREQUENCY                      Contractures Contractures Info: Not present    Additional Factors Info  Code Status, Allergies Code Status Info: Full Allergies Info: No Known Allergies           Current Medications (06/02/2024):  This is the current hospital active  medication list Current Facility-Administered Medications  Medication Dose Route Frequency Provider Last Rate Last Admin   artificial tears (LACRILUBE) ophthalmic ointment   Both Eyes Once Doretha Folks, MD       aspirin  EC tablet 325 mg  325 mg Oral Daily Rosina Norris A, PA-C   325 mg at 06/01/24 1342   atorvastatin  (LIPITOR) tablet 10 mg  10 mg Oral Daily Rosina Norris A, PA-C   10 mg at 06/01/24 1343   folic acid  (FOLVITE ) tablet 2.5 mg  2.5 mg Oral Daily Young, Travis J, DO   2.5 mg at 06/01/24 1343   And   pyridOXINE  (VITAMIN B6) tablet 25 mg  25 mg Oral Daily Young, Travis J, DO   25 mg at 06/01/24 1342   And   cyanocobalamin  (VITAMIN B12) tablet 2,000 mcg  2,000 mcg Oral Daily Young, Travis J, DO   2,000 mcg at 06/01/24 1343   donepezil  (ARICEPT ) tablet 5 mg  5 mg Oral QHS Rosina Norris A, PA-C   5 mg at 06/01/24 2138   mirabegron  ER (MYRBETRIQ ) tablet 25 mg  25 mg Oral Daily Rosina Norris A, PA-C   25 mg at 06/01/24 1344   QUEtiapine  (SEROQUEL ) tablet 12.5 mg  12.5 mg Oral Daily PRN White, Patrice L, NP   12.5 mg at 06/01/24 1725   QUEtiapine  (SEROQUEL ) tablet 25 mg  25 mg Oral  QHS White, Patrice L, NP   25 mg at 06/01/24 2138   tamsulosin  (FLOMAX ) capsule 0.4 mg  0.4 mg Oral Daily Rosina Almarie LABOR, PA-C   0.4 mg at 06/01/24 1344   Current Outpatient Medications  Medication Sig Dispense Refill   aspirin  325 MG tablet Take 325 mg by mouth at bedtime.     atorvastatin  (LIPITOR) 10 MG tablet Take 10 mg by mouth every evening.     cyanocobalamin  (VITAMIN B12) 1000 MCG tablet Take 1,000 mcg by mouth daily.     gabapentin (NEURONTIN) 300 MG capsule Take 300 mg by mouth 4 (four) times daily.     GEMTESA 75 MG TABS Take 1 tablet by mouth daily.     Multiple Vitamins-Minerals (PRESERVISION AREDS 2 PO) Take 1 tablet by mouth 2 (two) times daily.     risperiDONE  (RISPERDAL ) 0.25 MG tablet Take 0.25-0.5 mg by mouth See admin instructions. Take 2 tablets (0.5mg ) by mouth  at night, and 1 tablet (0.25mg ) in the afternoon.     tamsulosin  (FLOMAX ) 0.4 MG CAPS capsule Take 0.4 mg by mouth daily.     donepezil  (ARICEPT ) 5 MG tablet Take 5 mg by mouth at bedtime. (Patient not taking: Reported on 05/28/2024)     FOLPLEX 2.2 2.2-25-0.5 MG TABS Take 1 tablet by mouth daily. (Patient not taking: Reported on 05/28/2024)  0     Discharge Medications: Your Medication List   aspirin  325 MG tablet Take 325 mg by mouth at bedtime.   atorvastatin  10 MG tablet Commonly known as: LIPITOR Take 10 mg by mouth every evening.   cyanocobalamin  1000 MCG tablet Commonly known as: VITAMIN B12 Take 1,000 mcg by mouth daily.   donepezil  5 MG tablet Commonly known as: ARICEPT  Take 5 mg by mouth at bedtime.   Folplex 2.2 2.2-25-0.5 MG Tabs Generic drug: Folic Acid -Vit B6-Vit B12 Take 1 tablet by mouth daily.   gabapentin 300 MG capsule Commonly known as: NEURONTIN Take 300 mg by mouth 4 (four) times daily. Ask about: Which instructions should I use?   Gemtesa 75 MG Tabs Generic drug: Vibegron Take 1 tablet by mouth daily.   PRESERVISION AREDS 2 PO Take 1 tablet by mouth 2 (two) times daily.   risperiDONE  0.25 MG tablet Commonly known as: RISPERDAL  Take 0.25-0.5 mg by mouth See admin instructions. Take 2 tablets (0.5mg ) by mouth at night, and 1 tablet (0.25mg ) in the afternoon.   tamsulosin  0.4 MG Caps capsule Commonly known as: FLOMAX  Take 0.4 mg by mouth daily.    Relevant Imaging Results:  Relevant Lab Results:   Additional Information SSN:[id number]  Kari JONETTA Daisy, LCSW     "

## 2024-06-02 NOTE — ED Provider Notes (Signed)
 Emergency Medicine Observation Re-evaluation Note  [name] is a 83 y.o. male, seen on rounds today.  Pt initially presented to the ED for complaints of Altered Mental Status Currently, the patient is resting.  Physical Exam  BP (!) 148/83 (BP Location: Left Arm)   Pulse 91   Temp 97.7 F (36.5 C) (Axillary)   Resp 20   SpO2 98%  Physical Exam   ED Course / MDM  EKG:EKG Interpretation Date/Time:  Sunday May 28 2024 11:18:33 EST Ventricular Rate:  88 PR Interval:  147 QRS Duration:  82 QT Interval:  358 QTC Calculation: 434 R Axis:   59  Text Interpretation: Sinus rhythm Confirmed by Neysa Clap [phone]) on 05/28/2024 12:48:28 PM  I have reviewed the labs performed to date as well as medications administered while in observation.  Recent changes in the last 24 hours include .  Plan  Current plan is for discharge to [facility] today.    Dasie Faden, MD 06/02/24 [phone]

## 2024-06-02 NOTE — Progress Notes (Addendum)
 CSW outreached to [employer] by email to confirm plan is still for 9:30AM pick up.   Also informed Adele that [facility] reached out and stated pt is active for [facility] PT/OT/RN.   Addend @ 11:58AM Adele requested AVS. CSW sent via email.

## 2024-06-19 ENCOUNTER — Other Ambulatory Visit: Payer: Self-pay | Admitting: *Deleted

## 2024-06-19 NOTE — Patient Outreach (Signed)
 Complex Care Management   Visit Note  06/19/2024  Name:  [name] MRN: [id number] DOB: 1941/02/12  Situation: Referral received for Complex Care Management related to Dementia and placement support I obtained verbal consent from Caregiver.  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   Lumbar herniated disc     Assessment: Confirmed that patient has now been placed in a memory care facility-[facility] Patient Reported Symptoms:  Cognitive Cognitive Status: Difficulties with attention and concentration, Poor judgment in daily scenarios Cognitive/Intellectual Conditions Management [RPT]: Other Other: patient has been diagnosed with vascular dementia   Health Maintenance Behaviors: Annual physical exam Healing Pattern: Slow Health Facilitated by: Rest, Healthy diet  Neurological Neurological Review of Symptoms: No symptoms reported    HEENT HEENT Symptoms Reported: No symptoms reported      Cardiovascular Cardiovascular Symptoms Reported: No symptoms reported    Respiratory Respiratory Symptoms Reported: No symptoms reported    Endocrine Endocrine Symptoms Reported: No symptoms reported Is patient diabetic?: No    Gastrointestinal Gastrointestinal Symptoms Reported: No symptoms reported      Genitourinary Genitourinary Symptoms Reported: No symptoms reported    Integumentary Integumentary Symptoms Reported: No symptoms reported    Musculoskeletal Musculoskelatal Symptoms Reviewed: No symptoms reported        Psychosocial Psychosocial Symptoms Reported: Irritability Additional Psychological Details: spouse confirmed that patient has now been placed at [facility] Behavioral Management Strategies: Adequate rest, Medication therapy Major Change/Loss/Stressor/Fears (CP): Medical condition, self       06/19/2024    PHQ2-9 Depression Screening   Little interest or pleasure in doing things    Feeling down, depressed, or hopeless    PHQ-2 - Total Score    Trouble falling or staying asleep, or sleeping too much    Feeling tired or having little energy    Poor appetite or overeating     Feeling bad about yourself - or that you are a failure or have let yourself or your family down    Trouble concentrating on things, such as reading the newspaper or watching television    Moving or speaking so slowly that other people could have noticed.  Or the opposite - being so fidgety or restless that you have been moving around a lot more than usual    Thoughts that you would be better off dead, or hurting yourself in some way    PHQ2-9 Total Score    If you checked off any problems, how difficult have these problems made it for you to do your work, take care of things at home, or get along with other people    Depression Interventions/Treatment      There were no vitals filed for this visit. Pain Score: 0-No pain  Medications Reviewed Today     Reviewed by Ermalinda Lenn HERO, LCSW (Social Worker) on 06/19/24 at 1205  Med List Status: <None>   Medication Order Taking? Sig Documenting Provider Last Dose Status Informant  aspirin  325 MG tablet [id number] Yes Take 325 mg by mouth at bedtime. [provider]  Active Spouse/Significant Other  atorvastatin  (LIPITOR) 10 MG tablet [id number] Yes Take 10 mg by mouth every evening. [provider]  Active Spouse/Significant Other  cyanocobalamin  (VITAMIN B12) 1000 MCG tablet [id number] Yes Take 1,000 mcg by mouth  daily. [provider]  Active Spouse/Significant Other  donepezil  (ARICEPT ) 5 MG tablet [id number]  Take 5 mg by mouth at bedtime.  Patient not taking: Reported on 06/19/2024   [provider]  Active Spouse/Significant Other  FOLPLEX 2.2 2.2-25-0.5 MG TABS [id number]  Take 1 tablet by mouth daily.  Patient not taking:  Reported on 06/19/2024   [provider]  Active Spouse/Significant Other  gabapentin (NEURONTIN) 300 MG capsule [id number] Yes Take 300 mg by mouth 4 (four) times daily. [provider]  Active Spouse/Significant Other  GEMTESA 75 MG TABS [id number] Yes Take 1 tablet by mouth daily. [provider]  Active Spouse/Significant Other  Multiple Vitamins-Minerals (PRESERVISION AREDS 2 PO) [id number] Yes Take 1 tablet by mouth 2 (two) times daily. [provider]  Active Spouse/Significant Other  risperiDONE  (RISPERDAL ) 0.25 MG tablet [id number] Yes Take 0.25-0.5 mg by mouth See admin instructions. Take 2 tablets (0.5mg ) by mouth at night, and 1 tablet (0.25mg ) in the afternoon. [provider]  Active Spouse/Significant Other           Med Note ANGUS TINNIE Repress May 28, 2024  4:54 PM) Originally written for 0.25mg  at night, then upped to 0.5mg  at night and 0.25mg  in afternoon on 05/26/24.  tamsulosin  (FLOMAX ) 0.4 MG CAPS capsule [id number] Yes Take 0.4 mg by mouth daily. [provider]  Active Spouse/Significant Other            Recommendation:   PCP Follow-up Specialty provider follow-up -Neurology 06/29/24  Follow Up Plan:   Patient has met all care management goals. Care Management case will be closed. Patient has been provided contact information should new needs arise.   Bralynn Velador, LCSW [place]  [facility], [facility] Licensed Clinical Social Worker  Direct Dial: [phone]

## 2024-06-19 NOTE — Patient Instructions (Signed)
 Visit Information  Thank you for taking time to visit with me today. Please don't hesitate to contact me if I can be of assistance to you before our next scheduled appointment.  Your next care management appointment is no further scheduled appointments.    Patient has met all care management goals. Care Management case will be closed. Patient has been provided contact information should new needs arise.   Please call the care guide team at [phone] if you need to cancel, schedule, or reschedule an appointment.   Please call the Suicide and Crisis Lifeline: 988 call the [place]  National Suicide Prevention Lifeline: 317-830-3116 or TTY: 419-536-0878 TTY 205-633-9491) to talk to a trained counselor call 1-800-273-TALK (toll free, 24 hour hotline) call 911 if you are experiencing a Mental Health or Behavioral Health Crisis or need someone to talk to.  Kenzley Ke, LCSW [place]  [facility], [facility] Licensed Clinical Social Worker  Direct Dial: [phone]

## 2024-06-28 NOTE — Progress Notes (Signed)
 "   Dementia likely due to Alzheimer's disease, late onset  OMARE Murillo is a very pleasant 84 y.o. year old RH male with a history of hypertension, hyperlipidemia, seen today for evaluation of memory loss. MoCA today is 9/30 . Etiology is likely due to Alzheimer's disease. Patient is able to participate on ADLs and to drive without difficulties. Mood is fair. Patient is accompanied by his wife who supplement the history.  Follow up in 6 months   Continue donepezil  increase to 10 mg daily, side effects discussed. Will entertain adding memantine 10 mg twice daily in the near future for broader coverage Recommend good control of cardiovascular risk factors.   Continue to control mood as per PCP     Discussed the use of AI scribe software for clinical note transcription with the patient, who gave verbal consent to proceed.  History of Present Illness [name] is an 84 year old male who presents for evaluation of cognitive decline. He is accompanied by his wife, who provides additional history.  He reports that he has been experiencing memory problems for approximately three years, initially with minor issues such as misplacing items and forgetting short-term information like names and conversations. Over time, these issues have progressed to more significant memory lapses, including confusion about his home and family members.  He has been recently taking donepezil  5 mg daily for memory, prescribed by PCP.  His wife reports that he was previously very organized and methodical, but began losing important items and falling behind on bills about two to three years ago. He was admitted to a memory care facility, [facility], in December 2025 due to increasing confusion and belligerence, including not recognizing his home and confusing his wife with others.  He was placed on Seroquel  with good results.  He has a history of visual loss in the right eye due to retinal occlusion in 2015. He experiences  difficulty seeing peripherally with his right eye but can see with his left eye.  He reports chronic lower back pain. He has undergone treatments including injections, and currently he is on gabapentin he uses a walker for stability.  He has fallen in November 2025 as he has slipped from the ottoman as he was trying to grab the dog, hitting his head without LOC.  Negative imaging for acute findings.     No history of depression, anxiety, paranoia, seizures, or tremors. He has no significant issues with sleep, though it takes him longer to fall asleep and he occasionally wakes up during the night. He has a history of frequent urination but no incontinence, and experiences constipation.  He has gained seven pounds since moving to [place], where he eats more regularly. He has no history of alcohol  or tobacco use. His  sister having Alzheimer's disease and multiple maternal relatives affected by dementia.  He is a retired psychologist, educational, BUILDING SERVICES ENGINEER at [employer], has masters in audiological scientist  CT of the head without contrast November 2025 personally reviewed, without acute intracranial abnormality, mild cerebral atrophy, hippocampal atrophy   Allergies[1]  Current Outpatient Medications  Medication Instructions   aspirin  325 mg, Nightly   atorvastatin  (LIPITOR) 10 mg, Every evening   cyanocobalamin  (VITAMIN B12) 1,000 mcg, Daily   donepezil  (ARICEPT ) 5 mg, Daily at bedtime   FOLPLEX 2.2 2.2-25-0.5 MG TABS 1 tablet, Daily   gabapentin (NEURONTIN) 300 mg, 4 times daily   GEMTESA 75 MG TABS 1 tablet, Daily   Multiple Vitamins-Minerals (PRESERVISION AREDS 2 PO) 1  tablet, 2 times daily   risperiDONE  (RISPERDAL ) 0.25-0.5 mg, See admin instructions   tamsulosin  (FLOMAX ) 0.4 mg, Daily    VITALS:   Vitals:   06/29/24 0935  BP: 121/76  Pulse: 98  Resp: 18  SpO2: 100%  Weight: 127 lb (57.6 kg)  Height: 5' 6 (1.676 m)     Neurological Exam     06/29/2024   12:00 PM  Montreal Cognitive  Assessment   Visuospatial/ Executive (0/5) 0  Naming (0/3) 1  Attention: Read list of digits (0/2) 1  Attention: Read list of letters (0/1) 1  Attention: Serial 7 subtraction starting at 100 (0/3) 2  Language: Repeat phrase (0/2) 1  Language : Fluency (0/1) 0  Abstraction (0/2) 0  Delayed Recall (0/5) 0  Orientation (0/6) 3  Total 9  Adjusted Score (based on education) 9        No data to display            Orientation:  Alert and oriented to person, not to place or time. No aphasia or dysarthria. Fund of knowledge is reduced. Recent and remote memory impaired.  Attention and concentration are reduced.  Able to name objects 1/3 and repeat phrases 1/2.  Delayed recall 0/5 . Cranial nerves: There is good facial symmetry. Extraocular muscles are intact and visual fields are full to confrontational testing motor left to about the right there is decreased right peripheral vision, chronic. Speech is fluent and clear. No tongue deviation. Hearing is thick based to conversational tone. Tone: Tone is good throughout. Sensation: Sensation is intact to light touch.  Vibration is intact at the bilateral big toe.  Coordination: The patient has no difficulty with RAM's or FNF bilaterally. Normal finger to nose  Motor: Strength is 5/5 in the bilateral upper and lower extremities. There is no pronator drift. There are no fasciculations noted. DTR's: Deep tendon reflexes are 2/4 bilaterally. Gait and Station: The patient is able to ambulate without difficulty. Gait is cautious and narrow. Stride length is normal.  Uses a walker for stability      Thank you for allowing us  the opportunity to participate in the care of this nice patient. Please do not hesitate to contact us  for any questions or concerns.   Total time spent on today's visit was 61 minutes dedicated to this patient today, preparing to see patient, examining the patient, ordering tests and/or medications and counseling the patient,  documenting clinical information in the EHR or other health record, independently interpreting results and communicating results to the patient/family, discussing treatment and goals, answering patient's questions and coordinating care.  Cc:  Onita Rush, MD  Camie Sevin 06/29/2024 12:14 PM       [1] No Known Allergies  "

## 2024-06-29 ENCOUNTER — Ambulatory Visit: Admitting: Physician Assistant

## 2024-06-29 ENCOUNTER — Encounter: Payer: Self-pay | Admitting: Physician Assistant

## 2024-06-29 ENCOUNTER — Ambulatory Visit

## 2024-06-29 VITALS — BP 121/76 | HR 98 | Resp 18 | Ht 66.0 in | Wt 127.0 lb

## 2024-06-29 DIAGNOSIS — F0280 Dementia in other diseases classified elsewhere without behavioral disturbance: Secondary | ICD-10-CM

## 2024-06-29 DIAGNOSIS — G309 Alzheimer's disease, unspecified: Secondary | ICD-10-CM

## 2024-06-29 NOTE — Patient Instructions (Addendum)
 It was a pleasure to see you today at our office.   Recommendations:  Continue donepezil   to 10 mg daily. Side effects were discussed  Follow up in 6 months     [email or web address]   RECOMMENDATIONS FOR ALL PATIENTS WITH MEMORY PROBLEMS: 1. Continue to exercise (Recommend 30 minutes of walking everyday, or 3 hours every week) 2. Increase social interactions - continue going to [place] and enjoy social gatherings with friends and family 3. Eat healthy, avoid fried foods and eat more fruits and vegetables 4. Maintain adequate blood pressure, blood sugar, and blood cholesterol level. Reducing the risk of stroke and cardiovascular disease also helps promoting better memory. 5. Avoid stressful situations. Live a simple life and avoid aggravations. Organize your time and prepare for the next day in anticipation. 6. Sleep well, avoid any interruptions of sleep and avoid any distractions in the bedroom that may interfere with adequate sleep quality 7. Avoid sugar, avoid sweets as there is a strong link between excessive sugar intake, diabetes, and cognitive impairment We discussed the Mediterranean diet, which has been shown to help patients reduce the risk of progressive memory disorders and reduces cardiovascular risk. This includes eating fish, eat fruits and green leafy vegetables, nuts like almonds and hazelnuts, walnuts, and also use olive oil. Avoid fast foods and fried foods as much as possible. Avoid sweets and sugar as sugar use has been linked to worsening of memory function.  There is always a concern of gradual progression of memory problems. If this is the case, then we may need to adjust level of care according to patient needs. Support, both to the patient and caregiver, should then be put into place.      DRIVING: Regarding driving, in patients with progressive memory problems, driving will be impaired. We advise to have someone  else do the driving if trouble finding directions or if minor accidents are reported. Independent driving assessment is available to determine safety of driving.      FALL PRECAUTIONS: Be cautious when walking. Scan the area for obstacles that may increase the risk of trips and falls. When getting up in the mornings, sit up at the edge of the bed for a few minutes before getting out of bed. Consider elevating the bed at the head end to avoid drop of blood pressure when getting up. Walk always in a well-lit room (use night lights in the walls). Avoid area rugs or power cords from appliances in the middle of the walkways. Use a walker or a cane if necessary and consider physical therapy for balance exercise. Get your eyesight checked regularly.  FINANCIAL OVERSIGHT: Supervision, especially oversight when making financial decisions or transactions is also recommended.  HOME SAFETY: Consider the safety of the kitchen when operating appliances like stoves, microwave oven, and blender. Consider having supervision and share cooking responsibilities until no longer able to participate in those. Accidents with firearms and other hazards in the house should be identified and addressed as well.   ABILITY TO BE LEFT ALONE: If patient is unable to contact 911 operator, consider using LifeLine, or when the need is there, arrange for someone to stay with patients. Smoking is a fire hazard, consider supervision or cessation. Risk of wandering should be assessed by caregiver and if detected at any point, supervision and safe proof recommendations should be instituted.  MEDICATION SUPERVISION: Inability to self-administer medication needs to be constantly addressed. Implement a mechanism to ensure safe administration of the medications.  Mediterranean Diet A Mediterranean diet refers to food and lifestyle choices that are based on the traditions of countries located on the [employer]. This way of  eating has been shown to help prevent certain conditions and improve outcomes for people who have chronic diseases, like kidney disease and heart disease. What are tips for following this plan? Lifestyle  Cook and eat meals together with your family, when possible. Drink enough fluid to keep your urine clear or pale yellow. Be physically active every day. This includes: Aerobic exercise like running or swimming. Leisure activities like gardening, walking, or housework. Get 7-8 hours of sleep each night. If recommended by your health care provider, drink red wine in moderation. This means 1 glass a day for nonpregnant women and 2 glasses a day for men. A glass of wine equals 5 oz (150 mL). Reading food labels  Check the serving size of packaged foods. For foods such as rice and pasta, the serving size refers to the amount of cooked product, not dry. Check the total fat in packaged foods. Avoid foods that have saturated fat or trans fats. Check the ingredients list for added sugars, such as corn syrup. Shopping  At the grocery store, buy most of your food from the areas near the walls of the store. This includes: Fresh fruits and vegetables (produce). Grains, beans, nuts, and seeds. Some of these may be available in unpackaged forms or large amounts (in bulk). Fresh seafood. Poultry and eggs. Low-fat dairy products. Buy whole ingredients instead of prepackaged foods. Buy fresh fruits and vegetables in-season from local farmers markets. Buy frozen fruits and vegetables in resealable bags. If you do not have access to quality fresh seafood, buy precooked frozen shrimp or canned fish, such as tuna, salmon, or sardines. Buy small amounts of raw or cooked vegetables, salads, or olives from the deli or salad bar at your store. Stock your pantry so you always have certain foods on hand, such as olive oil, canned tuna, canned tomatoes, rice, pasta, and beans. Cooking  Cook foods with extra-virgin  olive oil instead of using butter or other vegetable oils. Have meat as a side dish, and have vegetables or grains as your main dish. This means having meat in small portions or adding small amounts of meat to foods like pasta or stew. Use beans or vegetables instead of meat in common dishes like chili or lasagna. Experiment with different cooking methods. Try roasting or broiling vegetables instead of steaming or sauteing them. Add frozen vegetables to soups, stews, pasta, or rice. Add nuts or seeds for added healthy fat at each meal. You can add these to yogurt, salads, or vegetable dishes. Marinate fish or vegetables using olive oil, lemon juice, garlic, and fresh herbs. Meal planning  Plan to eat 1 vegetarian meal one day each week. Try to work up to 2 vegetarian meals, if possible. Eat seafood 2 or more times a week. Have healthy snacks readily available, such as: Vegetable sticks with hummus. Greek yogurt. Fruit and nut trail mix. Eat balanced meals throughout the week. This includes: Fruit: 2-3 servings a day Vegetables: 4-5 servings a day Low-fat dairy: 2 servings a day Fish, poultry, or lean meat: 1 serving a day Beans and legumes: 2 or more servings a week Nuts and seeds: 1-2 servings a day Whole grains: 6-8 servings a day Extra-virgin olive oil: 3-4 servings a day Limit red meat and sweets to only a few servings a month What are my food choices?  Mediterranean diet Recommended Grains: Whole-grain pasta. Brown rice. Bulgar wheat. Polenta. Couscous. Whole-wheat bread. [place] Madeira. Vegetables: Artichokes. Beets. Broccoli. Cabbage. Carrots. Eggplant. Green beans. Chard. Kale. Spinach. Onions. Leeks. Peas. Squash. Tomatoes. Peppers. Radishes. Fruits: Apples. Apricots. Avocado. Berries. Bananas. Cherries. Dates. Figs. Grapes. Lemons. Melon. Oranges. Peaches. Plums. Pomegranate. Meats and other protein foods: Beans. Almonds. Sunflower seeds. Pine nuts. Peanuts. Cod. Salmon.  Scallops. Shrimp. Tuna. Tilapia. Clams. Oysters. Eggs. Dairy: Low-fat milk. Cheese. Greek yogurt. Beverages: Water. Red wine. Herbal tea. Fats and oils: Extra virgin olive oil. Avocado oil. Grape seed oil. Sweets and desserts: Greek yogurt with honey. Baked apples. Poached pears. Trail mix. Seasoning and other foods: Basil. Cilantro. Coriander. Cumin. Mint. Parsley. Sage. Rosemary. Tarragon. Garlic. Oregano. Thyme. Pepper. Balsalmic vinegar. Tahini. Hummus. Tomato sauce. Olives. Mushrooms. Limit these Grains: Prepackaged pasta or rice dishes. Prepackaged cereal with added sugar. Vegetables: Deep fried potatoes (french fries). Fruits: Fruit canned in syrup. Meats and other protein foods: Beef. Pork. Lamb. Poultry with skin. Hot dogs. [place]. Dairy: Ice cream. Sour cream. Whole milk. Beverages: Juice. Sugar-sweetened soft drinks. Beer. Liquor and spirits. Fats and oils: Butter. Canola oil. Vegetable oil. Beef fat (tallow). Lard. Sweets and desserts: Cookies. Cakes. Pies. Candy. Seasoning and other foods: Mayonnaise. Premade sauces and marinades. The items listed may not be a complete list. Talk with your dietitian about what dietary choices are right for you. Summary The Mediterranean diet includes both food and lifestyle choices. Eat a variety of fresh fruits and vegetables, beans, nuts, seeds, and whole grains. Limit the amount of red meat and sweets that you eat. Talk with your health care provider about whether it is safe for you to drink red wine in moderation. This means 1 glass a day for nonpregnant women and 2 glasses a day for men. A glass of wine equals 5 oz (150 mL). This information is not intended to replace advice given to you by your health care provider. Make sure you discuss any questions you have with your health care provider. Document Released: 01/23/2016 Document Revised: 02/25/2016 Document Reviewed: 01/23/2016 Elsevier Interactive Patient Education  2017 [employer].

## 2024-12-28 ENCOUNTER — Ambulatory Visit: Payer: Self-pay | Admitting: Physician Assistant
